# Patient Record
Sex: Male | Born: 1960 | Race: White | Hispanic: No | Marital: Married | Smoking: Current every day smoker
Health system: Southern US, Community
[De-identification: ages and names within clinical notes are randomized; demographics above are authoritative.]

## PROBLEM LIST (undated history)

## (undated) DIAGNOSIS — F17200 Nicotine dependence, unspecified, uncomplicated: Secondary | ICD-10-CM

## (undated) DIAGNOSIS — J302 Other seasonal allergic rhinitis: Secondary | ICD-10-CM

## (undated) DIAGNOSIS — R51 Headache: Secondary | ICD-10-CM

## (undated) DIAGNOSIS — N529 Male erectile dysfunction, unspecified: Secondary | ICD-10-CM

## (undated) HISTORY — DX: Nicotine dependence, unspecified, uncomplicated: F17.200

## (undated) HISTORY — DX: Headache: R51

## (undated) HISTORY — DX: Male erectile dysfunction, unspecified: N52.9

## (undated) HISTORY — DX: Other seasonal allergic rhinitis: J30.2

---
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
## 1999-02-15 ENCOUNTER — Emergency Department (HOSPITAL_COMMUNITY): Admission: EM | Admit: 1999-02-15 | Discharge: 1999-02-15 | Payer: Self-pay | Admitting: Emergency Medicine

## 1999-02-24 ENCOUNTER — Emergency Department (HOSPITAL_COMMUNITY): Admission: EM | Admit: 1999-02-24 | Discharge: 1999-02-24 | Payer: Self-pay | Admitting: Emergency Medicine

## 1999-07-04 ENCOUNTER — Encounter: Admission: RE | Admit: 1999-07-04 | Discharge: 1999-07-04 | Payer: Self-pay | Admitting: *Deleted

## 1999-07-04 ENCOUNTER — Encounter: Payer: Self-pay | Admitting: *Deleted

## 2010-10-06 ENCOUNTER — Encounter: Payer: Self-pay | Admitting: Family Medicine

## 2010-10-06 ENCOUNTER — Ambulatory Visit: Payer: Self-pay | Admitting: Family Medicine

## 2010-10-06 ENCOUNTER — Ambulatory Visit (INDEPENDENT_AMBULATORY_CARE_PROVIDER_SITE_OTHER): Payer: 59 | Admitting: Family Medicine

## 2010-10-06 ENCOUNTER — Ambulatory Visit
Admission: RE | Admit: 2010-10-06 | Discharge: 2010-10-06 | Disposition: A | Discharge status: Home / Self Care | Payer: 59 | Source: Ambulatory Visit | Attending: Family Medicine | Admitting: Family Medicine

## 2010-10-06 DIAGNOSIS — R1013 Epigastric pain: Secondary | ICD-10-CM

## 2010-10-06 DIAGNOSIS — N529 Male erectile dysfunction, unspecified: Secondary | ICD-10-CM | POA: Insufficient documentation

## 2010-10-06 DIAGNOSIS — Z23 Encounter for immunization: Secondary | ICD-10-CM

## 2010-10-06 DIAGNOSIS — J309 Allergic rhinitis, unspecified: Secondary | ICD-10-CM

## 2010-10-06 DIAGNOSIS — J302 Other seasonal allergic rhinitis: Secondary | ICD-10-CM | POA: Insufficient documentation

## 2010-10-06 LAB — CBC WITH DIFFERENTIAL/PLATELET
Basophils Absolute: 0 10*3/uL (ref 0.0–0.1)
Basophils Relative: 0.2 % (ref 0.0–3.0)
Eosinophils Absolute: 0.1 10*3/uL (ref 0.0–0.7)
Eosinophils Relative: 1 % (ref 0.0–5.0)
HCT: 45.3 % (ref 39.0–52.0)
Hemoglobin: 15.8 g/dL (ref 13.0–17.0)
Lymphocytes Relative: 15.7 % (ref 12.0–46.0)
Lymphs Abs: 1.3 10*3/uL (ref 0.7–4.0)
MCHC: 34.9 g/dL (ref 30.0–36.0)
MCV: 101.5 fl — ABNORMAL HIGH (ref 78.0–100.0)
Monocytes Absolute: 0.5 10*3/uL (ref 0.1–1.0)
Monocytes Relative: 5.7 % (ref 3.0–12.0)
Neutro Abs: 6.2 10*3/uL (ref 1.4–7.7)
Neutrophils Relative %: 77.4 % — ABNORMAL HIGH (ref 43.0–77.0)
Platelets: 186 10*3/uL (ref 150.0–400.0)
RBC: 4.47 Mil/uL (ref 4.22–5.81)
RDW: 12.5 % (ref 11.5–14.6)
WBC: 8 10*3/uL (ref 4.5–10.5)

## 2010-10-06 LAB — COMPREHENSIVE METABOLIC PANEL
ALT: 17 U/L (ref 0–53)
AST: 16 U/L (ref 0–37)
Albumin: 4.2 g/dL (ref 3.5–5.2)
Alkaline Phosphatase: 58 U/L (ref 39–117)
BUN: 15 mg/dL (ref 6–23)
CO2: 29 mEq/L (ref 19–32)
Calcium: 9.1 mg/dL (ref 8.4–10.5)
Chloride: 104 mEq/L (ref 96–112)
Creatinine, Ser: 0.9 mg/dL (ref 0.4–1.5)
GFR: 96.18 mL/min (ref >60.00)
Glucose, Bld: 89 mg/dL (ref 70–99)
Potassium: 4.3 mEq/L (ref 3.5–5.1)
Sodium: 139 mEq/L (ref 135–145)
Total Bilirubin: 0.9 mg/dL (ref 0.3–1.2)
Total Protein: 6.5 g/dL (ref 6.0–8.3)

## 2010-10-06 LAB — LIPASE: Lipase: 24 U/L (ref 11.0–59.0)

## 2010-10-06 MED ORDER — SILDENAFIL CITRATE 50 MG PO TABS: 50.0000 mg | ORAL_TABLET | Freq: Every day | ORAL | Status: DC | PRN

## 2010-10-06 NOTE — Progress Notes (Signed)
Subjective:    Patient ID: Gregory Johnson, male    DOB: Aug 15, 1960, 50 y.o.   MRN: [id number]  HPI CC: new patient, establish.  First time here, used to see Dr. Margrett Rud in [place], hasn't seen in years.  Firefighter.  Allergic rhinitis - takes zyrtec as needed, does well with this.  abd pain - Sometimes when eats greasy food, starts getting pain in middle of abdomen.  Sometimes bad pain.  Pain doesn't travel anywhere.  Sharp pain.  No burning or reflux sxs, not colicky or cramping.  Sudden onset, stops suddenly.  Lasts 10-30 min.  Used to chew tobacco years ago, not anymore.  Denies nausea/vomiting, diarrhea, constipation, blood in stool, fevers/chills.  Improved with weight loss (about 25 lbs in last several months, increased activity).  Last episode was 2-3 wks ago.  No early satiety.  ED - for last 6 wks.  Trouble getting erection and maintaining.  No problems with ejaculation.  Not getting erections when awakens in am.  Smoking - rare, when drinks beer.  Preventative: Blood work done 05/2010, CPE.  Cholesterol great.  Trig slightly high.  States sugar good and overall blood work was normal. Last tetanus - 7-8 years ago.  Exposed to children at work, will give Tdap.  Medications and allergies reviewed and updated in chart. There is no problem list on file for this patient.  Past Medical History  Diagnosis Date  . Seasonal allergies   . ED (erectile dysfunction)    Past Surgical History  Procedure Date  . Appendectomy 1978  . Hernia repair 1979    left side   History  Substance Use Topics  . Smoking status: Current Some Day Smoker    Types: Cigarettes  . Smokeless tobacco: Never Used  . Alcohol Use: Yes     Occasionally - 6 pack q2 wks   Family History  Problem Relation Age of Onset  . Diabetes Mother   . Hyperlipidemia Mother   . Dementia Father   . Cancer Maternal Grandmother     breast cancer  . Cancer Paternal Grandmother     bone cancer  . Coronary  artery disease Neg Hx   . Stroke Neg Hx    No Known Allergies No current outpatient prescriptions on file prior to visit.    Review of Systems  Constitutional: Negative for fever, chills, activity change, appetite change, fatigue and unexpected weight change.  HENT: Negative for hearing loss and neck pain.   Eyes: Negative for visual disturbance.  Respiratory: Negative for cough, chest tightness, shortness of breath and wheezing.   Cardiovascular: Negative for chest pain, palpitations and leg swelling.  Gastrointestinal: Positive for abdominal pain. Negative for nausea, vomiting, diarrhea, constipation, blood in stool and abdominal distention.  Genitourinary: Negative for hematuria and difficulty urinating.  Musculoskeletal: Negative for myalgias and arthralgias.  Skin: Negative for rash.  Neurological: Negative for dizziness, seizures, syncope and headaches.  Hematological: Does not bruise/bleed easily.  Psychiatric/Behavioral: Negative for dysphoric mood. The patient is not nervous/anxious.        Objective:   Physical Exam  Nursing note and vitals reviewed. Constitutional: He is oriented to person, place, and time. He appears well-developed and well-nourished. No distress.  HENT:  Head: Normocephalic and atraumatic.  Right Ear: External ear normal.  Left Ear: External ear normal.  Nose: Nose normal.  Mouth/Throat: Oropharynx is clear and moist.  Eyes: Conjunctivae and EOM are normal. Pupils are equal, round, and reactive to light.  Neck:  Normal range of motion. Neck supple. No thyromegaly present.  Cardiovascular: Normal rate, regular rhythm, normal heart sounds and intact distal pulses.   No murmur heard. Pulses:      Radial pulses are 2+ on the right side, and 2+ on the left side.  Pulmonary/Chest: Effort normal and breath sounds normal. No respiratory distress. He has no wheezes. He has no rales.  Abdominal: Soft. Bowel sounds are normal. He exhibits no distension. There  is hepatomegaly. There is no splenomegaly. There is no tenderness. There is no rebound and no guarding. Hernia confirmed negative in the right inguinal area and confirmed negative in the left inguinal area.       Some HM to 2 finger widths below R costal margin, nontender.  Genitourinary: Testes normal and penis normal. Right testis shows no mass. Left testis shows no mass. Circumcised. No penile tenderness.  Musculoskeletal: Normal range of motion.  Lymphadenopathy:    He has no cervical adenopathy.       Right: No inguinal adenopathy present.       Left: No inguinal adenopathy present.  Neurological: He is alert and oriented to person, place, and time.       CN grossly intact, station and gait intact  Skin: Skin is warm and dry. No rash noted.  Psychiatric: He has a normal mood and affect. His behavior is normal. Judgment and thought content normal.       Assessment & Plan:

## 2010-10-06 NOTE — Assessment & Plan Note (Signed)
Story possible GERD vs gallbladder etiology. ? HSM on exam, check LFTs and abd [place]. Consider PPI. no red flags.

## 2010-10-06 NOTE — Assessment & Plan Note (Signed)
Well controlled on zyrtec prn.

## 2010-10-06 NOTE — Patient Instructions (Signed)
Trial of viagra Tdap today. Drop off recent blood work. Liver check today, we will set you up with abdominal ultrasound. Return in 1 year or as needed for follow up, sooner if abdominal pain not improving. Call us with questions.  Good to meet you today.

## 2010-10-06 NOTE — Assessment & Plan Note (Signed)
Asked patient to drop off recent blood work, will review. Trial of viagra.  Discussed psychological etiology, may just need for confidence. If not improved, discussed may refer to urology (as endorses decreased nocturnal erections).  No fatigue, no mood issues, however consider testing testosterone.  Ensure has had TSH.

## 2010-10-07 LAB — B12 AND FOLATE PANEL
Folate: 7.5 ng/mL (ref >5.9)
Vitamin B-12: 231 pg/mL (ref 211–911)

## 2010-10-07 NOTE — Progress Notes (Signed)
Addended by: Josph Macho A on: 10/07/2010 10:29 AM   Modules accepted: Orders

## 2010-11-22 ENCOUNTER — Other Ambulatory Visit: Payer: Self-pay | Admitting: Family Medicine

## 2010-11-22 MED ORDER — CYANOCOBALAMIN 1000 MCG PO TABS: 1000.0000 ug | ORAL_TABLET | Freq: Every day | ORAL | Status: AC

## 2010-11-22 NOTE — Telephone Encounter (Signed)
Okay to refill? 

## 2010-11-22 NOTE — Telephone Encounter (Signed)
Ok to refill, but please check with patient if he ever dropped off previous blood work (cholesterol levels and thyroid?) Also added recommended B12 to list of meds.

## 2010-11-23 NOTE — Telephone Encounter (Signed)
Message left for patient to return my call.  

## 2010-11-25 NOTE — Telephone Encounter (Signed)
Spoke with patient. He said he forgot to drop of lab results, but he was writing himself a note to drop them off when he finds them. Notified him of refills and to start OTC B-12.

## 2011-05-25 ENCOUNTER — Other Ambulatory Visit: Payer: Self-pay | Admitting: Family Medicine

## 2011-05-25 NOTE — Telephone Encounter (Signed)
OK to refill

## 2011-09-30 ENCOUNTER — Other Ambulatory Visit: Payer: Self-pay | Admitting: Family Medicine

## 2011-10-16 HISTORY — PX: EPIDURAL BLOOD PATCH: SHX1517

## 2011-10-21 ENCOUNTER — Emergency Department (HOSPITAL_COMMUNITY)
Admission: EM | Admit: 2011-10-21 | Discharge: 2011-10-21 | Disposition: A | Discharge status: Home / Self Care | Payer: 59 | Source: Home / Self Care | Attending: Emergency Medicine | Admitting: Emergency Medicine

## 2011-10-21 ENCOUNTER — Encounter (HOSPITAL_COMMUNITY): Payer: Self-pay | Admitting: *Deleted

## 2011-10-21 DIAGNOSIS — E860 Dehydration: Secondary | ICD-10-CM

## 2011-10-21 DIAGNOSIS — H81399 Other peripheral vertigo, unspecified ear: Secondary | ICD-10-CM

## 2011-10-21 DIAGNOSIS — H9319 Tinnitus, unspecified ear: Secondary | ICD-10-CM

## 2011-10-21 LAB — POCT I-STAT, CHEM 8
BUN: 8 mg/dL (ref 6–23)
Calcium, Ion: 1.23 mmol/L (ref 1.12–1.23)
Creatinine, Ser: 0.8 mg/dL (ref 0.50–1.35)
Glucose, Bld: 118 mg/dL — ABNORMAL HIGH (ref 70–99)
Sodium: 142 mEq/L (ref 135–145)
TCO2: 22 mmol/L (ref 0–100)

## 2011-10-21 MED ORDER — MECLIZINE HCL 12.5 MG PO TABS: 12.5000 mg | ORAL_TABLET | Freq: Three times a day (TID) | ORAL | Status: AC | PRN

## 2011-10-21 NOTE — ED Notes (Addendum)
Reports waking up with posterior HA this AM.  States normally if he has a HA, is able to take Advil with complete relief.  Has taken "6 Advil" today without any relief.  C/O decreased hearing in bilat ears ("stopped up") throughout the day, admits he has some slight tinnitus.  Has had "very little" sinus drainage.  Denies weakness, parasthesias.  C/O some slight nausea "if I'm up", which improves if he lays down.

## 2011-10-21 NOTE — ED Provider Notes (Signed)
History     CSN: [id number]  Arrival date & time 10/21/11  1718   First MD Initiated Contact with Patient 10/21/11 1724      Chief Complaint  Patient presents with  . Headache  . Hearing Problem    (Consider location/radiation/quality/duration/timing/severity/associated sxs/prior treatment) HPI Comments: Patient describes that this morning he woke up with a headache The back of his head. He took 2 tablets of Motrin and went to work. Headache seemed to be about the same but has noticed somewhat since yesterday worsening today at both of these ears feel stopped up and describes that when he moves around changes position feels somewhat dizzy and have also perceive a ringing sound from both ears. On further questioning patient admits of having a almost constant sinus congestion and postnasal drainage. Patient denies symptoms such as numbness, tingling, weakness of any upper or lower extremity or facial changes or speech problems.  As he is sitting he feels fine but if he moves slightly from one side to the next or moves abruptly feels somewhat a bit dizzy. Patient denies any fevers, constitutional symptoms such as fatigue, unintentional weight loss, arthralgias or myalgias, and no visual changes.  Patient is a 51 y.o. male presenting with headaches. The history is provided by the patient.  Headache The primary symptoms include headaches and dizziness. Primary symptoms do not include syncope, loss of consciousness, altered mental status, seizures, visual change, paresthesias, focal weakness, loss of sensation, memory loss, fever or vomiting. The symptoms began 6 to 12 hours ago. The symptoms are unchanged. The neurological symptoms are focal.  The headache is not associated with aura, photophobia, eye pain, visual change, neck stiffness, paresthesias or weakness.  Dizziness also occurs with tinnitus. Dizziness does not occur with vomiting, weakness or diaphoresis.   Additional symptoms include  tinnitus and vertigo. Additional symptoms do not include neck stiffness, weakness, lower back pain, leg pain, photophobia, aura, nystagmus, taste disturbance or hyperacusis.    Past Medical History  Diagnosis Date  . Seasonal allergies   . ED (erectile dysfunction)     Past Surgical History  Procedure Date  . Appendectomy 1978  . Hernia repair 1979    left side    Family History  Problem Relation Age of Onset  . Diabetes Mother   . Hyperlipidemia Mother   . Dementia Father   . Cancer Maternal Grandmother     breast cancer  . Cancer Paternal Grandmother     bone cancer  . Coronary artery disease Neg Hx   . Stroke Neg Hx     History  Substance Use Topics  . Smoking status: Current Some Day Smoker    Types: Cigarettes  . Smokeless tobacco: Never Used  . Alcohol Use: Yes     Occasionally      Review of Systems  Constitutional: Positive for activity change. Negative for fever, chills, diaphoresis and fatigue.  HENT: Positive for ear pain, congestion, postnasal drip, sinus pressure and tinnitus. Negative for sore throat, facial swelling, sneezing, mouth sores, trouble swallowing, neck pain, neck stiffness and dental problem.   Eyes: Negative for photophobia and pain.  Cardiovascular: Negative for syncope.  Gastrointestinal: Negative for vomiting.  Skin: Negative for rash.  Neurological: Positive for dizziness, vertigo and headaches. Negative for tremors, focal weakness, seizures, loss of consciousness, syncope, speech difficulty, weakness, light-headedness, numbness and paresthesias.  Psychiatric/Behavioral: Negative for memory loss, behavioral problems, confusion, decreased concentration, agitation and altered mental status.    Allergies  Review of  patient's allergies indicates no known allergies.  Home Medications   Current Outpatient Rx  Name Route Sig Dispense Refill  . CETIRIZINE HCL 10 MG PO TABS Oral Take 10 mg by mouth daily as needed.      .  CYANOCOBALAMIN 1000 MCG PO TABS Oral Take 1 tablet (1,000 mcg total) by mouth daily. 100 tablet 2  . VIAGRA 50 MG PO TABS  TAKE 1 TABLET (50 MG TOTAL) BY MOUTH DAILY AS NEEDED FOR ERECTILE DYSFUNCTION. 10 tablet 1  . MECLIZINE HCL 12.5 MG PO TABS Oral Take 1 tablet (12.5 mg total) by mouth 3 (three) times daily as needed. 15 tablet 0    BP 116/74  Pulse 62  Temp 98 F (36.7 C) (Oral)  Resp 16  SpO2 97%  Physical Exam  Nursing note and vitals reviewed. Constitutional: He is oriented to person, place, and time. He appears well-developed and well-nourished. No distress.  HENT:  Head: Normocephalic.  Right Ear: Tympanic membrane normal.  Left Ear: Tympanic membrane normal.  Mouth/Throat: Uvula is midline. Mucous membranes are not pale, dry and not cyanotic. No oropharyngeal exudate, posterior oropharyngeal edema, posterior oropharyngeal erythema or tonsillar abscesses.  Eyes: Conjunctivae and EOM are normal. Pupils are equal, round, and reactive to light.  Cardiovascular: Normal rate.  Exam reveals no gallop and no friction rub.   No murmur heard. Pulmonary/Chest: Effort normal.  Neurological: He is alert and oriented to person, place, and time. He has normal strength. He is not disoriented. He displays no tremor. No cranial nerve deficit or sensory deficit. He exhibits normal muscle tone. He displays a negative Romberg sign. Coordination abnormal. Gait normal. GCS eye subscore is 4. GCS verbal subscore is 5. GCS motor subscore is 6.    ED Course  Procedures (including critical care time)  Labs Reviewed  POCT I-STAT, CHEM 8 - Abnormal; Notable for the following:    Glucose, Bld 118 (*)     All other components within normal limits   No results found.   1. Peripheral vertigo   2. Tinnitus   3. Dehydration       MDM  Nonspecific headache associated with bilateral otalgia mild positional vertigo and tinnitus of both ears. Symptoms for less than 24 hours, patient neurological  exam was intact including ambulation test. Denies any paresthesias, or muscular weakness. Patient clinically with dry oral) left suggesting mild dehydration. Year and neurological exam were unremarkable. Have encouraged patient to pursue a empirical treatment with meclizine for the next 48-72 hours and to rehydrate himself aggressively in the next 12 hours. Patient was instructed about symptoms that should warrant immediate emergent attention such as worsening headache, as well as other neurological symptoms such as visual changes, ambulation changes numbness, or muscular weakness. Patient agree with treatment plan and followup care as necessary. Also encouraged patient to followup with his primary care Dr. if symptoms were to persist beyond a week to further elucidate this vertigo syndrome if no improvement he understands and agrees with followup care      Jimmie Molly, MD 10/21/11 1836

## 2011-10-24 ENCOUNTER — Ambulatory Visit (INDEPENDENT_AMBULATORY_CARE_PROVIDER_SITE_OTHER): Payer: 59 | Admitting: Family Medicine

## 2011-10-24 ENCOUNTER — Encounter: Payer: Self-pay | Admitting: Family Medicine

## 2011-10-24 ENCOUNTER — Telehealth: Payer: Self-pay

## 2011-10-24 ENCOUNTER — Ambulatory Visit
Admission: RE | Admit: 2011-10-24 | Discharge: 2011-10-24 | Disposition: A | Discharge status: Home / Self Care | Payer: 59 | Source: Ambulatory Visit | Attending: Family Medicine | Admitting: Family Medicine

## 2011-10-24 VITALS — BP 122/76 | HR 56 | Temp 98.0°F

## 2011-10-24 DIAGNOSIS — R51 Headache with orthostatic component, not elsewhere classified: Secondary | ICD-10-CM

## 2011-10-24 DIAGNOSIS — R519 Headache, unspecified: Secondary | ICD-10-CM | POA: Insufficient documentation

## 2011-10-24 HISTORY — DX: Headache with orthostatic component, not elsewhere classified: R51.0

## 2011-10-24 MED ORDER — TRAMADOL HCL 50 MG PO TABS: 50.0000 mg | ORAL_TABLET | Freq: Two times a day (BID) | ORAL | Status: AC | PRN

## 2011-10-24 MED ORDER — GADOBENATE DIMEGLUMINE 529 MG/ML IV SOLN
17.0000 mL | Freq: Once | INTRAVENOUS | Status: AC | PRN
  Administered 2011-10-24: 17 mL via INTRAVENOUS

## 2011-10-24 MED ORDER — DOXYCYCLINE MONOHYDRATE 100 MG PO CAPS: 100.0000 mg | ORAL_CAPSULE | Freq: Two times a day (BID) | ORAL | Status: AC

## 2011-10-24 NOTE — Assessment & Plan Note (Addendum)
Given recent tick bite, will treat empirically with 10 d course of doxycycline. 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 sxs going on 3 days, not improving. rec increase caffeine intake, increase fluid intake as well Check brain MRI with contrast to dx this as well as r/o other causes such as stroke, subdural hematoma. Consider blood patch, referral to neurology.

## 2011-10-24 NOTE — Addendum Note (Signed)
Addended by: Eustaquio Boyden on: 10/24/2011 10:34 AM   Modules accepted: Orders

## 2011-10-24 NOTE — Progress Notes (Signed)
Subjective:    Patient ID: Gregory Johnson, male    DOB: 10-06-1960, 51 y.o.   MRN: [id number]  HPI CC: f/u [facility]  sxs started 4d ago.  Started with headache, took advil and then tylenol which didn't help.  Whey laying down felt better.  Whenever sitting or standing up, pain would return.  Decreased appetite, not eating or drinking.  Some occasional nausea 2/2 pain.  Initially did have some dizziness described as pressure in head, that has improved.  Laying flat completely resolves sxs.  Headache described as pressure and throbbing that starts at base of neck, then travels bilaterally to temples.  At worst - 7-8/10.  Currently 4/10.  When laying down 1/10.  Some photophobia, no phonophobia.  Seen at [facility], records reviewed.  Prescribed meclizine which hasn't helped at all.    No fevers/chills, head congestion, ST, coughing, ear pain.  No abd pain, chest pain, vomiting.  No current dizziness.  No h/o migraines. No recent steroid shots into back.  Has pulled a few ticks off this summer. No rashes, joint pains.  Smoker - about 1/2 ppd, hasn't smoked in 3 days.  No family history of strokes. States lipids checked at work normal.  Medications and allergies reviewed and updated in chart.  Past histories reviewed and updated if relevant as below. Patient Active Problem List  Diagnosis  . ED (erectile dysfunction)  . Seasonal allergies  . Epigastric abdominal pain   Past Medical History  Diagnosis Date  . Seasonal allergies   . ED (erectile dysfunction)    Past Surgical History  Procedure Date  . Appendectomy 1978  . Hernia repair 1979    left side   History  Substance Use Topics  . Smoking status: Current Some Day Smoker    Types: Cigarettes  . Smokeless tobacco: Never Used  . Alcohol Use: Yes     Occasionally   Family History  Problem Relation Age of Onset  . Diabetes Mother   . Hyperlipidemia Mother   . Dementia Father   . Cancer Maternal Grandmother     breast  cancer  . Cancer Paternal Grandmother     bone cancer  . Coronary artery disease Neg Hx   . Stroke Neg Hx    No Known Allergies Current Outpatient Prescriptions on File Prior to Visit  Medication Sig Dispense Refill  . cetirizine (ZYRTEC) 10 MG tablet Take 10 mg by mouth daily as needed.        . cyanocobalamin ([facility] VITAMIN B12) 1000 MCG tablet Take 1 tablet (1,000 mcg total) by mouth daily.  100 tablet  2  . meclizine (ANTIVERT) 12.5 MG tablet Take 1 tablet (12.5 mg total) by mouth 3 (three) times daily as needed.  15 tablet  0  . VIAGRA 50 MG tablet TAKE 1 TABLET (50 MG TOTAL) BY MOUTH DAILY AS NEEDED FOR ERECTILE DYSFUNCTION.  10 tablet  1     Review of Systems Per HPI    Objective:   Physical Exam  Vitals reviewed. Constitutional: He appears well-developed and well-nourished. No distress.  HENT:  Head: Normocephalic and atraumatic.  Right Ear: Hearing, tympanic membrane, external ear and ear canal normal.  Left Ear: Hearing, tympanic membrane, external ear and ear canal normal.  Nose: Right sinus exhibits no maxillary sinus tenderness and no frontal sinus tenderness. Left sinus exhibits no maxillary sinus tenderness and no frontal sinus tenderness.  Mouth/Throat: Uvula is midline, oropharynx is clear and moist and mucous membranes are  normal. No oropharyngeal exudate, posterior oropharyngeal edema, posterior oropharyngeal erythema or tonsillar abscesses.  Eyes: Conjunctivae and EOM are normal. Pupils are equal, round, and reactive to light. No scleral icterus. Right eye exhibits normal extraocular motion and no nystagmus. Left eye exhibits normal extraocular motion and no nystagmus.       Fundoscopic exam with intact optic nerves  Neck: Normal range of motion. Neck supple. Carotid bruit is not present.  Cardiovascular: Normal rate, regular rhythm, normal heart sounds and intact distal pulses.   No murmur heard. Pulmonary/Chest: Effort normal and breath sounds normal. No  respiratory distress. He has no wheezes. He has no rales.  Neurological: He has normal strength. No cranial nerve deficit or sensory deficit. He displays a negative Romberg sign. Coordination normal.       CN2-12 intact. Normal FTN, HTS. Neg pronator drift.  Skin: Skin is warm and dry. No rash noted.       Assessment & Plan:

## 2011-10-24 NOTE — Patient Instructions (Signed)
I think you have low CSF pressure headache - I want to obtain MRI to eval for this and rule out other cause such as subdural hematoma.   I want you to decrease smoking and increase amount of caffeine you drink for next several days - as this can improve pain. Depending on MRI results, may send you to neurologist. If worsening, please seek urgent care again.

## 2011-10-24 NOTE — Telephone Encounter (Signed)
Spoke with Gregory Johnson. Will refer to neurology.  Consider blood patch. Will try to expedite neuro referral.

## 2011-10-24 NOTE — Telephone Encounter (Signed)
When get MR brain results please call Delorese Kenna [phone].

## 2011-10-25 ENCOUNTER — Ambulatory Visit
Admission: RE | Admit: 2011-10-25 | Discharge: 2011-10-25 | Disposition: A | Discharge status: Home / Self Care | Payer: 59 | Source: Ambulatory Visit | Attending: Neurology | Admitting: Neurology

## 2011-10-25 ENCOUNTER — Other Ambulatory Visit: Payer: Self-pay | Admitting: Neurology

## 2011-10-25 DIAGNOSIS — R51 Headache: Secondary | ICD-10-CM

## 2011-10-25 MED ORDER — IOHEXOL 180 MG/ML  SOLN
15.0000 mL | Freq: Once | INTRAMUSCULAR | Status: AC | PRN
  Administered 2011-10-25: 15 mL via INTRATHECAL

## 2011-10-25 NOTE — Progress Notes (Addendum)
20cc blood drawn for procedure from left [facility] space without difficulty; site unremarkable.  Discharge instructions and procedure explained to patient and his wife, who is at bedside.  Larina Earthly, RN  1300 pt returned after blood patch and will be watched for 30 minutes post procedure.   wife at bedside.

## 2011-10-31 ENCOUNTER — Telehealth: Payer: Self-pay | Admitting: Family Medicine

## 2011-10-31 NOTE — Telephone Encounter (Signed)
Spoke with patient's wife. She did say that she finally talked with someone at neuro office who was calling the dr, so now she is just waiting to hear back again. I advised to give them a little more time because they may be trying to get MRI ordered and approved. She did advise no fevers. Neck pressure is positional and he is staying well hydrated. She will call us back if he worsens or if anything changes.

## 2011-10-31 NOTE — Telephone Encounter (Signed)
I'm glad headache is better.  Is neck pressure also positional? I would give neuro more time - if has not heard from them by end of week to call me, update me sooner if other sxs. Has he been staying well hydrated?  plz ensure no fevers.

## 2011-10-31 NOTE — Telephone Encounter (Signed)
Pt had a blood patch to patch spinal fluid. It took pressure off his head but he still has pressure on his neck. The Neuro Doc said they wanted to do a MRI of his back if the pain did not go away. Pt's wife has called 3 times and they haven't gotten back to them yet. She was wondering what they should do or if you need to evaluate him ??

## 2011-11-02 ENCOUNTER — Other Ambulatory Visit (HOSPITAL_COMMUNITY): Payer: Self-pay | Admitting: Neurology

## 2011-11-02 ENCOUNTER — Other Ambulatory Visit: Payer: Self-pay | Admitting: Neurology

## 2011-11-02 DIAGNOSIS — R51 Headache: Secondary | ICD-10-CM

## 2011-11-03 ENCOUNTER — Other Ambulatory Visit: Payer: Self-pay | Admitting: Neurology

## 2011-11-03 ENCOUNTER — Ambulatory Visit
Admission: RE | Admit: 2011-11-03 | Discharge: 2011-11-03 | Disposition: A | Discharge status: Home / Self Care | Payer: 59 | Source: Ambulatory Visit | Attending: Neurology | Admitting: Neurology

## 2011-11-03 VITALS — BP 116/70 | HR 62

## 2011-11-03 LAB — GRAM STAIN: Gram Stain: NONE SEEN

## 2011-11-03 LAB — CSF CELL COUNT WITH DIFFERENTIAL
Tube #: 4
WBC, CSF: 10 cu mm — ABNORMAL HIGH (ref 0–5)

## 2011-11-03 LAB — GLUCOSE, CSF: Glucose, CSF: 89 mg/dL — ABNORMAL HIGH (ref 43–76)

## 2011-11-03 LAB — PROTEIN, CSF: Total Protein, CSF: 686 mg/dL — ABNORMAL HIGH (ref 15–45)

## 2011-11-03 MED ORDER — IOHEXOL 300 MG/ML  SOLN
10.0000 mL | Freq: Once | INTRAMUSCULAR | Status: AC | PRN
  Administered 2011-11-03: 10 mL via INTRATHECAL

## 2011-11-03 MED ORDER — DIAZEPAM 5 MG PO TABS
10.0000 mg | ORAL_TABLET | Freq: Once | ORAL | Status: AC
  Administered 2011-11-03: 10 mg via ORAL

## 2011-11-03 NOTE — Progress Notes (Signed)
Dr. Alfredo Batty in to speak with patient about findings after procedure.  jkl

## 2011-11-03 NOTE — Progress Notes (Signed)
Explained to pt he is to have a lumbar puncture as well as a myelogram and that the discharge instructions are the same as when he had his blood patch last week.  Pt states his headache is better post blood patch.

## 2011-11-03 NOTE — Progress Notes (Addendum)
Dr. Alfredo Batty in to speak to pt and his wife re: lumbar puncture and myelogram. Got a mixed injection and the procedure will be repeated next Friday. Dr. Alfredo Batty spoke at length explained all that was done and why it needed to be repeated. [email or web address] rn

## 2011-11-03 NOTE — Progress Notes (Signed)
Patient states he has been off Tramadol for the past two days.  Larina Earthly, RN

## 2011-11-03 NOTE — Progress Notes (Signed)
Patient's wife at bedside.  Dr. Alfredo Batty to discuss findings from procedure with her.  Patient resting quietly without complaint.  jkl

## 2011-11-06 LAB — HERPES SIMPLEX VIRUS(HSV) DNA BY PCR
HSV 1 DNA: NOT DETECTED
HSV 2 DNA: NOT DETECTED

## 2011-11-07 ENCOUNTER — Other Ambulatory Visit: Payer: Self-pay | Admitting: Neurology

## 2011-11-10 ENCOUNTER — Other Ambulatory Visit (HOSPITAL_COMMUNITY): Payer: Self-pay | Admitting: Neurology

## 2011-11-10 ENCOUNTER — Ambulatory Visit
Admission: RE | Admit: 2011-11-10 | Discharge: 2011-11-10 | Disposition: A | Discharge status: Home / Self Care | Payer: 59 | Source: Ambulatory Visit | Attending: Neurology | Admitting: Neurology

## 2011-11-10 ENCOUNTER — Other Ambulatory Visit (HOSPITAL_COMMUNITY): Payer: Self-pay | Admitting: Radiology

## 2011-11-10 VITALS — BP 109/70 | HR 63

## 2011-11-10 DIAGNOSIS — R51 Headache: Secondary | ICD-10-CM

## 2011-11-10 MED ORDER — IOHEXOL 300 MG/ML  SOLN
10.0000 mL | Freq: Once | INTRAMUSCULAR | Status: AC | PRN
  Administered 2011-11-10: 10 mL via INTRATHECAL

## 2011-11-10 MED ORDER — DIAZEPAM 5 MG PO TABS
10.0000 mg | ORAL_TABLET | Freq: Once | ORAL | Status: AC
  Administered 2011-11-10: 10 mg via ORAL

## 2011-11-10 NOTE — Progress Notes (Signed)
Pt states he has been off tramadol for the past 2 days. 

## 2011-11-13 ENCOUNTER — Other Ambulatory Visit (HOSPITAL_COMMUNITY): Payer: Self-pay | Admitting: Neurology

## 2011-11-13 ENCOUNTER — Ambulatory Visit (HOSPITAL_COMMUNITY)
Admission: RE | Admit: 2011-11-13 | Discharge: 2011-11-13 | Disposition: A | Discharge status: Home / Self Care | Payer: 59 | Source: Ambulatory Visit | Attending: Neurology | Admitting: Neurology

## 2011-11-13 ENCOUNTER — Inpatient Hospital Stay (HOSPITAL_COMMUNITY): Admission: RE | Admit: 2011-11-13 | Payer: 59 | Source: Ambulatory Visit

## 2011-11-13 DIAGNOSIS — R51 Headache: Secondary | ICD-10-CM

## 2011-11-13 DIAGNOSIS — R839 Unspecified abnormal finding in cerebrospinal fluid: Secondary | ICD-10-CM | POA: Insufficient documentation

## 2011-11-13 MED ORDER — IOHEXOL 180 MG/ML  SOLN
20.0000 mL | Freq: Once | INTRAMUSCULAR | Status: AC | PRN
  Administered 2011-11-13: 3 mL via EPIDURAL

## 2011-11-19 ENCOUNTER — Encounter: Payer: Self-pay | Admitting: Family Medicine

## 2012-03-02 ENCOUNTER — Other Ambulatory Visit: Payer: Self-pay | Admitting: Family Medicine

## 2012-04-03 ENCOUNTER — Other Ambulatory Visit: Payer: Self-pay | Admitting: *Deleted

## 2012-04-03 ENCOUNTER — Other Ambulatory Visit: Payer: Self-pay | Admitting: Internal Medicine

## 2012-04-03 ENCOUNTER — Other Ambulatory Visit (INDEPENDENT_AMBULATORY_CARE_PROVIDER_SITE_OTHER): Payer: 59

## 2012-04-03 DIAGNOSIS — Z0000 Encounter for general adult medical examination without abnormal findings: Secondary | ICD-10-CM

## 2012-04-03 LAB — HIV ANTIBODY (ROUTINE TESTING W REFLEX): HIV: NONREACTIVE

## 2012-04-03 LAB — HEPATITIS A ANTIBODY, IGM: Hep A IgM: NEGATIVE

## 2012-04-03 LAB — HEPATITIS B SURFACE ANTIBODY,QUALITATIVE: Hep B S Ab: UNDETERMINED — AB

## 2012-04-03 LAB — RPR

## 2012-04-04 LAB — CLOSTRIDIUM DIFFICILE BY PCR: Toxigenic C. Difficile by PCR: NOT DETECTED

## 2012-04-07 LAB — STOOL CULTURE

## 2012-06-11 LAB — CBC
HGB: 14.9 g/dL
platelet count: 207

## 2012-06-11 LAB — LIPID PANEL
Cholesterol: 156 mg/dL (ref 0–200)
LDL (calc): 106

## 2012-06-11 LAB — COMPREHENSIVE METABOLIC PANEL
ALT: 14 U/L (ref 10–40)
ALT: 14 U/L (ref 10–40)
AST: 14 U/L
Alkaline Phosphatase: 46 U/L
Creat: 0.95
Creat: 0.95
Glucose: 82
Sodium: 142 mmol/L (ref 137–147)
Total Bilirubin: 0.6 mg/dL
Total Bilirubin: 0.6 mg/dL

## 2012-09-03 ENCOUNTER — Other Ambulatory Visit: Payer: Self-pay | Admitting: Family Medicine

## 2012-10-01 ENCOUNTER — Other Ambulatory Visit: Payer: Self-pay | Admitting: Family Medicine

## 2012-10-04 ENCOUNTER — Other Ambulatory Visit: Payer: Self-pay | Admitting: Family Medicine

## 2012-10-07 ENCOUNTER — Other Ambulatory Visit: Payer: Self-pay

## 2012-10-07 MED ORDER — SILDENAFIL CITRATE 50 MG PO TABS: ORAL_TABLET | ORAL | Status: DC

## 2012-10-07 NOTE — Telephone Encounter (Signed)
Pt left v/m has CPX scheduled 10/21/12; pt request refill Viagra to [facility] [address]. Pt request cb when refilled.

## 2012-10-07 NOTE — Telephone Encounter (Signed)
plz notify sent in. 

## 2012-10-08 ENCOUNTER — Encounter: Payer: Self-pay | Admitting: Family Medicine

## 2012-10-08 ENCOUNTER — Ambulatory Visit (INDEPENDENT_AMBULATORY_CARE_PROVIDER_SITE_OTHER): Payer: 59 | Admitting: Family Medicine

## 2012-10-08 VITALS — BP 126/82 | HR 80 | Temp 98.4°F | Ht 72.0 in | Wt 188.8 lb

## 2012-10-08 DIAGNOSIS — R972 Elevated prostate specific antigen [PSA]: Secondary | ICD-10-CM

## 2012-10-08 DIAGNOSIS — Z1211 Encounter for screening for malignant neoplasm of colon: Secondary | ICD-10-CM

## 2012-10-08 DIAGNOSIS — Z0000 Encounter for general adult medical examination without abnormal findings: Secondary | ICD-10-CM | POA: Insufficient documentation

## 2012-10-08 DIAGNOSIS — N529 Male erectile dysfunction, unspecified: Secondary | ICD-10-CM

## 2012-10-08 DIAGNOSIS — Z0001 Encounter for general adult medical examination with abnormal findings: Secondary | ICD-10-CM | POA: Insufficient documentation

## 2012-10-08 LAB — HEMOCCULT GUIAC POC 1CARD (OFFICE): Fecal Occult Blood, POC: NEGATIVE

## 2012-10-08 MED ORDER — SILDENAFIL CITRATE 50 MG PO TABS: ORAL_TABLET | ORAL | Status: DC

## 2012-10-08 NOTE — Progress Notes (Signed)
Subjective:    Patient ID: Gregory Johnson, male    DOB: 11-Dec-1960, 52 y.o.   MRN: [id number]  HPI CC: CPE today  No trouble recently.  orthostatic headaches resolved after blood patch (10/2011).  Brings blood work from city - 2013 PSA 0.7, then 2014 jumped up to 2.77.    Smoking - some days up to 1/2 ppd, with EtOH.  Preventative: No recent CPE Colon cancer screening - discussed, would like iFOB. Prostate screening - see above. Tdap - 2012  Caffeine: 2 cups coffee, 1 coke daily, lots of soda Lives with wife, 2 daughters who recently graduated college, 3 dogs Edu: some college Occupation: IT sales professional Activity: walks, treadmill, started going to gym Diet: good water, some fruits/vegetables   Medications and allergies reviewed and updated in chart.  Past histories reviewed and updated if relevant as below. Patient Active Problem List   Diagnosis Date Noted  . Orthostatic headache 10/24/2011  . Epigastric abdominal pain 10/06/2010  . ED (erectile dysfunction)   . Seasonal allergies    Past Medical History  Diagnosis Date  . Seasonal allergies   . ED (erectile dysfunction)   . Orthostatic headache 10/24/2011    found to have T3/4 prominent lateral osteophytes on CT myelo with epidural contrast at levels below this, resolved after fluoro guided blood patch (Dr. Milinda Pointer)   Past Surgical History  Procedure Laterality Date  . Appendectomy  1978  . Hernia repair  1979    left side  . Epidural blood patch  10/2011    spontaneous low CSF pressure HAs   History  Substance Use Topics  . Smoking status: Current Some Day Smoker    Types: Cigarettes  . Smokeless tobacco: Never Used  . Alcohol Use: Yes     Comment: Occasionally   Family History  Problem Relation Age of Onset  . Diabetes Mother   . Hyperlipidemia Mother   . Dementia Father 51  . Cancer Maternal Grandmother     breast cancer  . Cancer Paternal Grandmother     bone cancer  . Coronary artery disease Neg Hx    . Stroke Neg Hx    No Known Allergies Current Outpatient Prescriptions on File Prior to Visit  Medication Sig Dispense Refill  . sildenafil (VIAGRA) 50 MG tablet TAKE 1 TABLET (50 MG TOTAL) BY MOUTH DAILY AS NEEDED FOR ERECTILE DYSFUNCTION.  8 tablet  3  . cetirizine (ZYRTEC) 10 MG tablet Take 10 mg by mouth daily as needed.         No current facility-administered medications on file prior to visit.      Review of Systems  Constitutional: Negative for fever, chills, activity change, appetite change, fatigue and unexpected weight change.  HENT: Negative for hearing loss and neck pain.   Eyes: Negative for visual disturbance.  Respiratory: Negative for cough, chest tightness, shortness of breath and wheezing.   Cardiovascular: Negative for chest pain, palpitations and leg swelling.  Gastrointestinal: Negative for nausea, vomiting, abdominal pain, diarrhea, constipation, blood in stool and abdominal distention.  Genitourinary: Negative for hematuria and difficulty urinating.  Musculoskeletal: Negative for myalgias and arthralgias.  Skin: Negative for rash.  Neurological: Negative for dizziness, seizures, syncope and headaches.  Hematological: Negative for adenopathy. Does not bruise/bleed easily.  Psychiatric/Behavioral: Negative for dysphoric mood. The patient is not nervous/anxious.        Objective:   Physical Exam  Nursing note and vitals reviewed. Constitutional: He is oriented to person, place, and time.  He appears well-developed and well-nourished. No distress.  HENT:  Head: Normocephalic and atraumatic.  Right Ear: Hearing, tympanic membrane, external ear and ear canal normal.  Left Ear: Hearing, tympanic membrane, external ear and ear canal normal.  Nose: Nose normal.  Mouth/Throat: Oropharynx is clear and moist. No oropharyngeal exudate.  Eyes: Conjunctivae and EOM are normal. Pupils are equal, round, and reactive to light. No scleral icterus.  Neck: Normal range of  motion. Neck supple. No thyromegaly present.  Cardiovascular: Normal rate, regular rhythm, normal heart sounds and intact distal pulses.   No murmur heard. Pulses:      Radial pulses are 2+ on the right side, and 2+ on the left side.  Pulmonary/Chest: Effort normal and breath sounds normal. No respiratory distress. He has no wheezes. He has no rales.  Abdominal: Soft. Bowel sounds are normal. He exhibits no distension and no mass. There is no tenderness. There is no rebound and no guarding.  Genitourinary: Rectum normal. Rectal exam shows no external hemorrhoid, no internal hemorrhoid, no fissure, no mass, no tenderness and anal tone normal. Guaiac negative stool. Prostate is enlarged (20gm). Prostate is not tender.  Musculoskeletal: Normal range of motion. He exhibits no edema.  Lymphadenopathy:    He has no cervical adenopathy.  Neurological: He is alert and oriented to person, place, and time.  CN grossly intact, station and gait intact  Skin: Skin is warm and dry. No rash noted.  Psychiatric: He has a normal mood and affect. His behavior is normal. Judgment and thought content normal.       Assessment & Plan:

## 2012-10-08 NOTE — Assessment & Plan Note (Signed)
Preventative protocols reviewed and updated unless pt declined. Discussed healthy diet and lifestyle.  Stool kit today. DRE today.  Increased bump in PSA over the last year - will recommend he return in 6 months to repeat PSA (lab visit).  Pt agrees with plan.

## 2012-10-08 NOTE — Assessment & Plan Note (Signed)
Refilled viagra

## 2012-10-08 NOTE — Patient Instructions (Signed)
I've sent in viagra refill. Pass by lab to pick up stool kit today. Return in 6 months for recheck prostate level (lab visit only). We will call you with results. Good to see you today.  Call us with qeustions.

## 2012-10-09 ENCOUNTER — Other Ambulatory Visit: Payer: Self-pay | Admitting: Family Medicine

## 2012-10-14 ENCOUNTER — Encounter: Payer: Self-pay | Admitting: Family Medicine

## 2012-10-14 ENCOUNTER — Other Ambulatory Visit: Payer: 59

## 2012-10-21 ENCOUNTER — Encounter: Payer: 59 | Admitting: Family Medicine

## 2012-11-15 ENCOUNTER — Encounter: Payer: Self-pay | Admitting: Occupational Medicine

## 2013-04-07 ENCOUNTER — Other Ambulatory Visit: Payer: Self-pay | Admitting: Family Medicine

## 2013-04-23 ENCOUNTER — Other Ambulatory Visit: Payer: 59

## 2013-08-17 ENCOUNTER — Ambulatory Visit (INDEPENDENT_AMBULATORY_CARE_PROVIDER_SITE_OTHER): Payer: 59 | Admitting: Family Medicine

## 2013-08-17 ENCOUNTER — Ambulatory Visit: Payer: 59

## 2013-08-17 VITALS — BP 118/80 | HR 88 | Temp 98.3°F | Resp 16 | Ht 72.0 in | Wt 192.0 lb

## 2013-08-17 DIAGNOSIS — R0981 Nasal congestion: Secondary | ICD-10-CM

## 2013-08-17 DIAGNOSIS — J329 Chronic sinusitis, unspecified: Secondary | ICD-10-CM

## 2013-08-17 MED ORDER — AMOXICILLIN-POT CLAVULANATE 875-125 MG PO TABS: 1.0000 | ORAL_TABLET | Freq: Two times a day (BID) | ORAL | Status: DC

## 2013-08-17 NOTE — Progress Notes (Signed)
Patient ID: Gregory Johnson MRN: [id number], DOB: 12/18/1960, 53 y.o. Date of Encounter: 08/17/2013, 2:33 PM  Primary Physician: Eustaquio BoydenJavier Gutierrez, MD  Chief Complaint:  Chief Complaint  Patient presents with  . 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 Afebrile. No chills. Nasal congestion thick and green/yellow. Sinus pressure is the worst symptom. Cough is productive secondary to post nasal drip and not associated with time of day. Ears feel full, leading to sensation of muffled hearing. Has tried OTC cold preps without success. No GI complaints.   No recent antibiotics, recent travels, or sick contacts   No leg trauma, sedentary periods, h/o cancer, or tobacco use.  Past Medical History  Diagnosis Date  . Seasonal allergies   . ED (erectile dysfunction)   . Orthostatic headache 10/24/2011    found to have T3/4 prominent lateral osteophytes on CT myelo with epidural contrast at levels below this, resolved after fluoro guided blood patch (Dr. Milinda Pointer[facility]an [facility])     Home Meds: Prior to Admission medications   Medication Sig Start Date End Date Taking? Authorizing Provider  cetirizine (ZYRTEC) 10 MG tablet Take 10 mg by mouth daily as needed.      Historical Provider, MD  VIAGRA 50 MG tablet TAKE 1 TABLET (50 MG TOTAL) BY MOUTH DAILY AS NEEDED FOR ERECTILE DYSFUNCTION. 04/07/13   Eustaquio BoydenJavier Gutierrez, MD    Allergies: No Known Allergies  History   Social History  . Marital Status: Married    Spouse Name: N/A    Number of Children: 2  . Years of Education: some coll   Occupational History  . firefighter Unemployed   Social History Main Topics  . Smoking status: Current Some Day Smoker    Types: Cigarettes  . Smokeless tobacco: Never Used  . Alcohol Use: Yes     Comment: Occasionally  . Drug Use: No  . Sexual Activity: Not on file   Other Topics  Concern  . Not on file   Social History Narrative   Caffeine: 2 cups coffee, 1 coke daily, lots of soda   Lives with wife, 2 daughters who recently graduated college, 3 dogs   Edu: some college   Occupation: IT sales professionalirefighter   Activity: walks, treadmill, started going to gym   Diet: good water, some fruits/vegetables      Review of Systems: Constitutional: negative for chills, fever, night sweats or weight changes Cardiovascular: negative for chest pain or palpitations Respiratory: negative for hemoptysis, wheezing, or shortness of breath Abdominal: negative for abdominal pain, nausea, vomiting or diarrhea Dermatological: negative for rash Neurologic: negative for headache   Physical Exam: Blood pressure 118/80, pulse 88, temperature 98.3 F (36.8 C), temperature source Oral, resp. rate 16, height 6' (1.829 m), weight 192 lb (87.091 kg), SpO2 98.00%., Body mass index is 26.03 kg/(m^2). General: Well developed, well nourished, in no acute distress. Head: Normocephalic, atraumatic, eyes without discharge, sclera non-icteric, nares are congested. Bilateral auditory canals clear, TM's are without perforation, pearly grey with reflective cone of light bilaterally. Serous effusion bilaterally behind TM's. Maxillary sinus TTP. Oral cavity moist, dentition normal. Posterior pharynx with post nasal drip and mild erythema. No peritonsillar abscess or tonsillar exudate. Neck: Supple. No thyromegaly. Full ROM. No lymphadenopathy. Lungs: Clear bilaterally to auscultation without wheezes, rales, or rhonchi. Breathing is unlabored.  Heart: RRR with S1 S2. No  murmurs, rubs, or gallops appreciated. Msk:  Strength and tone normal for age. Extremities: No clubbing or cyanosis. No edema. Neuro: Alert and oriented X 3. Moves all extremities spontaneously. CNII-XII grossly in tact. Psych:  Responds to questions appropriately with a normal affect.   [facility] reading (PRIMARY) by  Dr. Milus GlazierLauenstein:  Byrd HesselbachWaters view of  sinuses:  No fluid levels    ASSESSMENT AND PLAN:  53 y.o. year old male with sinusitis -Sinus congestion - Plan: DG SinUS 1-2 Views  augmentin 875 bid x 10 days  -Tylenol/Motrin prn -Rest/fluids -RTC precautions -RTC 3-5 days if no improvement  Signed, Elvina SidleKurt Khanh Tanori, MD 08/17/2013 2:33 PM

## 2013-08-17 NOTE — Patient Instructions (Signed)

## 2013-10-01 ENCOUNTER — Other Ambulatory Visit: Payer: Self-pay | Admitting: Family Medicine

## 2013-10-01 ENCOUNTER — Other Ambulatory Visit: Payer: 59

## 2013-10-01 DIAGNOSIS — Z125 Encounter for screening for malignant neoplasm of prostate: Secondary | ICD-10-CM

## 2013-10-01 DIAGNOSIS — D7589 Other specified diseases of blood and blood-forming organs: Secondary | ICD-10-CM

## 2013-10-01 DIAGNOSIS — Z0000 Encounter for general adult medical examination without abnormal findings: Secondary | ICD-10-CM

## 2013-10-09 ENCOUNTER — Encounter: Payer: 59 | Admitting: Family Medicine

## 2013-10-27 ENCOUNTER — Other Ambulatory Visit: Payer: Self-pay | Admitting: Family Medicine

## 2013-11-04 NOTE — Telephone Encounter (Signed)
Pt called to ck on status of refill of viagra; spoke with pharmacist at [facility] [address] and rx will be ready for pick up in 1 hr/ pt is scheduling CPX.

## 2013-12-03 ENCOUNTER — Ambulatory Visit: Payer: 59 | Admitting: Family Medicine

## 2013-12-08 ENCOUNTER — Telehealth: Payer: Self-pay | Admitting: Family Medicine

## 2013-12-08 ENCOUNTER — Ambulatory Visit (INDEPENDENT_AMBULATORY_CARE_PROVIDER_SITE_OTHER): Payer: 59 | Admitting: Family Medicine

## 2013-12-08 ENCOUNTER — Encounter: Payer: Self-pay | Admitting: Family Medicine

## 2013-12-08 VITALS — BP 114/76 | HR 76 | Temp 98.5°F | Ht 72.0 in | Wt 196.5 lb

## 2013-12-08 DIAGNOSIS — Z1211 Encounter for screening for malignant neoplasm of colon: Secondary | ICD-10-CM

## 2013-12-08 DIAGNOSIS — F4321 Adjustment disorder with depressed mood: Secondary | ICD-10-CM

## 2013-12-08 DIAGNOSIS — Z0000 Encounter for general adult medical examination without abnormal findings: Secondary | ICD-10-CM

## 2013-12-08 DIAGNOSIS — N529 Male erectile dysfunction, unspecified: Secondary | ICD-10-CM

## 2013-12-08 MED ORDER — SILDENAFIL CITRATE 100 MG PO TABS: ORAL_TABLET | ORAL | Status: DC

## 2013-12-08 NOTE — Patient Instructions (Signed)
Stool kit today. Let's watch mood - if persistent trouble in 1-2 months, let me know. Return as needed or in 1 year for next physical. Bring me copy of labwork from city. Good to see you today, call us with quesitons.

## 2013-12-08 NOTE — Progress Notes (Signed)
Pre visit review using our clinic review tool, if applicable. No additional management support is needed unless otherwise documented below in the visit note. 

## 2013-12-08 NOTE — Progress Notes (Signed)
BP 114/76  Pulse 76  Temp(Src) 98.5 F (36.9 C) (Oral)  Ht 6' (1.829 m)  Wt 196 lb 8 oz (89.132 kg)  BMI 26.64 kg/m2   CC: CPE  Subjective:    Patient ID: Gregory Johnson, male    DOB: 04/01/1961, 53 y.o.   MRN: [id number]  HPI: Gregory Johnson is a 53 y.o. 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 Will look for this at home and bring me copy. States PSA back to normal and chol levels better.  Smoking - some days usually with EtOH.   3 friends passed away recently. One from cancer. Having trouble with this. 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 This keeps him busy.  Preventative: Colon cancer screening - discussed, would like iFOB. Did not turn in last year. Encouraged to turn this in this year. Prostate screening - see above.  Tdap - 2012  Flu - gets for free.  Seat belt use discussed Sunscreen use discussed. No suspicious moles.  Caffeine: 2 cups coffee, 1 coke daily, lots of soda  Lives with wife, 2 daughters who recently graduated college, 3 dogs  Edu: some college  Occupation: IT sales professional  Activity: walks, treadmill, started going to gym  Diet: good water, some fruits/vegetables   Relevant past medical, surgical, family and social history reviewed and updated as indicated.  Allergies and medications reviewed and updated. Current Outpatient Prescriptions on File Prior to Visit  Medication Sig  . cetirizine (ZYRTEC) 10 MG tablet Take 10 mg by mouth daily as needed.     No current facility-administered medications on file prior to visit.    Review of Systems  Constitutional: Negative for fever, chills, activity change, appetite change, fatigue and unexpected weight change.  HENT: Negative for hearing loss.   Eyes:  Negative for visual disturbance.  Respiratory: Negative for cough, chest tightness, shortness of breath and wheezing.   Cardiovascular: Negative for chest pain, palpitations and leg swelling.  Gastrointestinal: Negative for nausea, vomiting, abdominal pain, diarrhea, constipation, blood in stool and abdominal distention.  Genitourinary: Negative for hematuria and difficulty urinating.  Musculoskeletal: Negative for arthralgias, myalgias and neck pain.  Skin: Negative for rash.  Neurological: Negative for dizziness, seizures, syncope and headaches.  Hematological: Negative for adenopathy. Does not bruise/bleed easily.  Psychiatric/Behavioral: Positive for dysphoric mood (recent friends' passings). The patient is not nervous/anxious.    Per HPI unless specifically indicated above    Objective:    BP 114/76  Pulse 76  Temp(Src) 98.5 F (36.9 C) (Oral)  Ht 6' (1.829 m)  Wt 196 lb 8 oz (89.132 kg)  BMI 26.64 kg/m2  Physical Exam  Nursing note and vitals reviewed. Constitutional: He is oriented to person, place, and time. He appears well-developed and well-nourished. No distress.  HENT:  Head: Normocephalic and atraumatic.  Right Ear: Hearing, tympanic membrane, external ear and ear canal normal.  Left Ear: Hearing, tympanic membrane, external ear and ear canal normal.  Nose: Nose normal.  Mouth/Throat: Uvula is midline, oropharynx is clear and moist and mucous membranes are normal. No oropharyngeal exudate, posterior oropharyngeal edema or posterior oropharyngeal erythema.  Eyes: Conjunctivae and EOM are normal. Pupils are equal, round, and reactive to light. No  scleral icterus.  Neck: Normal range of motion. Neck supple. No thyromegaly present.  Cardiovascular: Normal rate, regular rhythm, normal heart sounds and intact distal pulses.   No murmur heard. Pulses:      Radial pulses are 2+ on the right side, and 2+ on the left side.  Pulmonary/Chest: Effort normal and breath sounds  normal. No respiratory distress. He has no wheezes. He has no rales.  Abdominal: Soft. Bowel sounds are normal. He exhibits no distension and no mass. There is no tenderness. There is no rebound and no guarding.  Genitourinary: Rectum normal. Rectal exam shows no external hemorrhoid, no internal hemorrhoid, no fissure, no mass, no tenderness and anal tone normal. Prostate is enlarged (20gm). Prostate is not tender.  Musculoskeletal: Normal range of motion. He exhibits no edema.  Lymphadenopathy:    He has no cervical adenopathy.  Neurological: He is alert and oriented to person, place, and time.  CN grossly intact, station and gait intact  Skin: Skin is warm and dry. No rash noted.  Psychiatric: He has a normal mood and affect. His behavior is normal. Judgment and thought content normal.       Assessment & Plan:   Problem List Items Addressed This Visit   ED (erectile dysfunction)     viagra refilled. ?early BPH - will continue to monitor.    Healthcare maintenance - Primary     Preventative protocols reviewed and updated unless pt declined. Discussed healthy diet and lifestyle. I've asked patient to bring me copy of recent labwork from city to review.    Mourning     Discussed normal grieving process - advised notify me if sxs persist past 1-2 mo. Pt agrees with plan.     Other Visit Diagnoses   Special screening for malignant neoplasms, colon        Relevant Orders       Fecal occult blood, imunochemical        Follow up plan: Return in about 1 year (around 12/09/2014), or as needed, for annual exam, prior fasting for blood work.

## 2013-12-08 NOTE — Assessment & Plan Note (Addendum)
Preventative protocols reviewed and updated unless pt declined. Discussed healthy diet and lifestyle. I've asked patient to bring me copy of recent labwork from city to review.

## 2013-12-08 NOTE — Assessment & Plan Note (Signed)
viagra refilled. ?early BPH - will continue to monitor.

## 2013-12-08 NOTE — Telephone Encounter (Signed)
Relevant patient education mailed to patient.  

## 2013-12-08 NOTE — Assessment & Plan Note (Signed)
Discussed normal grieving process - advised notify me if sxs persist past 1-2 mo. Pt agrees with plan.

## 2014-05-14 LAB — COMPREHENSIVE METABOLIC PANEL
ALT: 18
AST: 23 U/L
Alkaline Phosphatase: 52 U/L
CREATININE: 0.99
Glucose: 95
Sodium: 140
TOTAL PROTEIN: 6.2 g/dL
Total Bilirubin: 0.6 mg/dL

## 2014-05-14 LAB — CBC
HEMOGLOBIN: 15.7 g/dL
WBC: 6.6
platelet count: 238

## 2014-05-14 LAB — LIPID PANEL
CHOLESTEROL: 171
HDL: 38
LDL (calc): 113
Triglycerides: 102

## 2014-05-14 LAB — PSA: PSA: 0.62

## 2014-09-15 ENCOUNTER — Other Ambulatory Visit: Payer: Self-pay | Admitting: Family Medicine

## 2014-10-05 ENCOUNTER — Other Ambulatory Visit: Payer: Self-pay | Admitting: Orthopedic Surgery

## 2014-10-05 DIAGNOSIS — M25511 Pain in right shoulder: Secondary | ICD-10-CM

## 2014-10-05 DIAGNOSIS — M719 Bursopathy, unspecified: Secondary | ICD-10-CM

## 2014-10-05 DIAGNOSIS — M25512 Pain in left shoulder: Principal | ICD-10-CM

## 2014-10-07 ENCOUNTER — Ambulatory Visit
Admission: RE | Admit: 2014-10-07 | Discharge: 2014-10-07 | Disposition: A | Discharge status: Home / Self Care | Payer: 59 | Source: Ambulatory Visit | Attending: Orthopedic Surgery | Admitting: Orthopedic Surgery

## 2014-10-07 DIAGNOSIS — M25512 Pain in left shoulder: Principal | ICD-10-CM

## 2014-10-07 DIAGNOSIS — M25511 Pain in right shoulder: Secondary | ICD-10-CM

## 2014-10-07 DIAGNOSIS — M719 Bursopathy, unspecified: Secondary | ICD-10-CM

## 2014-11-16 HISTORY — PX: OTHER SURGICAL HISTORY: SHX169

## 2014-12-19 ENCOUNTER — Other Ambulatory Visit: Payer: Self-pay | Admitting: Family Medicine

## 2014-12-25 NOTE — Telephone Encounter (Signed)
Pt left v/m requesting refill viagra to [facility] [address]. Left v/m for pt to ck with [facility] [address].

## 2014-12-29 ENCOUNTER — Encounter: Payer: Self-pay | Admitting: *Deleted

## 2014-12-29 ENCOUNTER — Ambulatory Visit (INDEPENDENT_AMBULATORY_CARE_PROVIDER_SITE_OTHER): Payer: 59 | Admitting: Family Medicine

## 2014-12-29 ENCOUNTER — Encounter: Payer: Self-pay | Admitting: Family Medicine

## 2014-12-29 VITALS — BP 116/70 | HR 64 | Temp 98.3°F | Ht 72.0 in | Wt 182.5 lb

## 2014-12-29 DIAGNOSIS — Z1211 Encounter for screening for malignant neoplasm of colon: Secondary | ICD-10-CM | POA: Diagnosis not present

## 2014-12-29 DIAGNOSIS — Z0000 Encounter for general adult medical examination without abnormal findings: Secondary | ICD-10-CM

## 2014-12-29 DIAGNOSIS — R634 Abnormal weight loss: Secondary | ICD-10-CM

## 2014-12-29 DIAGNOSIS — G4700 Insomnia, unspecified: Secondary | ICD-10-CM

## 2014-12-29 LAB — POC HEMOCCULT BLD/STL (OFFICE/1-CARD/DIAGNOSTIC)
Card #1 Date: 9
Fecal Occult Blood, POC: NEGATIVE

## 2014-12-29 NOTE — Progress Notes (Signed)
Pre visit review using our clinic review tool, if applicable. No additional management support is needed unless otherwise documented below in the visit note. 

## 2014-12-29 NOTE — Patient Instructions (Addendum)
Bring me copy of labwork.  Start trial of benadryl $RemoveBef'25mg'NcZZnodWBh$  nightly for sleep Pass by lab to pick up stool kit.  If continued weight loss, let me know and return for labs.   Health Maintenance A healthy lifestyle and preventative care can promote health and wellness.  Maintain regular health, dental, and eye exams.  Eat a healthy diet. Foods like vegetables, fruits, whole grains, low-fat dairy products, and lean protein foods contain the nutrients you need and are low in calories. Decrease your intake of foods high in solid fats, added sugars, and salt. Get information about a proper diet from your health care provider, if necessary.  Regular physical exercise is one of the most important things you can do for your health. Most adults should get at least 150 minutes of moderate-intensity exercise (any activity that increases your heart rate and causes you to sweat) each week. In addition, most adults need muscle-strengthening exercises on 2 or more days a week.   Maintain a healthy weight. The body mass index (BMI) is a screening tool to identify possible weight problems. It provides an estimate of body fat based on height and weight. Your health care provider can find your BMI and can help you achieve or maintain a healthy weight. For males 20 years and older:  A BMI below 18.5 is considered underweight.  A BMI of 18.5 to 24.9 is normal.  A BMI of 25 to 29.9 is considered overweight.  A BMI of 30 and above is considered obese.  Maintain normal blood lipids and cholesterol by exercising and minimizing your intake of saturated fat. Eat a balanced diet with plenty of fruits and vegetables. Blood tests for lipids and cholesterol should begin at age 63 and be repeated every 5 years. If your lipid or cholesterol levels are high, you are over age 68, or you are at high risk for heart disease, you may need your cholesterol levels checked more frequently.Ongoing high lipid and cholesterol levels should  be treated with medicines if diet and exercise are not working.  If you smoke, find out from your health care provider how to quit. If you do not use tobacco, do not start.  Lung cancer screening is recommended for adults aged 73-80 years who are at high risk for developing lung cancer because of a history of smoking. A yearly low-dose CT scan of the lungs is recommended for people who have at least a 30-pack-year history of smoking and are current smokers or have quit within the past 15 years. A pack year of smoking is smoking an average of 1 pack of cigarettes a day for 1 year (for example, a 30-pack-year history of smoking could mean smoking 1 pack a day for 30 years or 2 packs a day for 15 years). Yearly screening should continue until the smoker has stopped smoking for at least 15 years. Yearly screening should be stopped for people who develop a health problem that would prevent them from having lung cancer treatment.  If you choose to drink alcohol, do not have more than 2 drinks per day. One drink is considered to be 12 oz (360 mL) of beer, 5 oz (150 mL) of wine, or 1.5 oz (45 mL) of liquor.  Avoid the use of street drugs. Do not share needles with anyone. Ask for help if you need support or instructions about stopping the use of drugs.  High blood pressure causes heart disease and increases the risk of stroke. Blood pressure should be  checked at least every 1-2 years. Ongoing high blood pressure should be treated with medicines if weight loss and exercise are not effective.  If you are 77-59 years old, ask your health care provider if you should take aspirin to prevent heart disease.  Diabetes screening involves taking a blood sample to check your fasting blood sugar level. This should be done once every 3 years after age 3 if you are at a normal weight and without risk factors for diabetes. Testing should be considered at a younger age or be carried out more frequently if you are overweight  and have at least 1 risk factor for diabetes.  Colorectal cancer can be detected and often prevented. Most routine colorectal cancer screening begins at the age of 74 and continues through age 43. However, your health care provider may recommend screening at an earlier age if you have risk factors for colon cancer. On a yearly basis, your health care provider may provide home test kits to check for hidden blood in the stool. A small camera at the end of a tube may be used to directly examine the colon (sigmoidoscopy or colonoscopy) to detect the earliest forms of colorectal cancer. Talk to your health care provider about this at age 76 when routine screening begins. A direct exam of the colon should be repeated every 5-10 years through age 62, unless early forms of precancerous polyps or small growths are found.  People who are at an increased risk for hepatitis B should be screened for this virus. You are considered at high risk for hepatitis B if:  You were born in a country where hepatitis B occurs often. Talk with your health care provider about which countries are considered high risk.  Your parents were born in a high-risk country and you have not received a shot to protect against hepatitis B (hepatitis B vaccine).  You have HIV or AIDS.  You use needles to inject street drugs.  You live with, or have sex with, someone who has hepatitis B.  You are a man who has sex with other men (MSM).  You get hemodialysis treatment.  You take certain medicines for conditions like cancer, organ transplantation, and autoimmune conditions.  Hepatitis C blood testing is recommended for all people born from [birth year] through 1965 and any individual with known risk factors for hepatitis C.  Healthy men should no longer receive prostate-specific antigen (PSA) blood tests as part of routine cancer screening. Talk to your health care provider about prostate cancer screening.  Testicular cancer screening is not  recommended for adolescents or adult males who have no symptoms. Screening includes self-exam, a health care provider exam, and other screening tests. Consult with your health care provider about any symptoms you have or any concerns you have about testicular cancer.  Practice safe sex. Use condoms and avoid high-risk sexual practices to reduce the spread of sexually transmitted infections (STIs).  You should be screened for STIs, including gonorrhea and chlamydia if:  You are sexually active and are younger than 24 years.  You are older than 24 years, and your health care provider tells you that you are at risk for this type of infection.  Your sexual activity has changed since you were last screened, and you are at an increased risk for chlamydia or gonorrhea. Ask your health care provider if you are at risk.  If you are at risk of being infected with HIV, it is recommended that you take a prescription medicine  daily to prevent HIV infection. This is called pre-exposure prophylaxis (PrEP). You are considered at risk if:  You are a man who has sex with other men (MSM).  You are a heterosexual man who is sexually active with multiple partners.  You take drugs by injection.  You are sexually active with a partner who has HIV.  Talk with your health care provider about whether you are at high risk of being infected with HIV. If you choose to begin PrEP, you should first be tested for HIV. You should then be tested every 3 months for as long as you are taking PrEP.  Use sunscreen. Apply sunscreen liberally and repeatedly throughout the day. You should seek shade when your shadow is shorter than you. Protect yourself by wearing long sleeves, pants, a wide-brimmed hat, and sunglasses year round whenever you are outdoors.  Tell your health care provider of new moles or changes in moles, especially if there is a change in shape or color. Also, tell your health care provider if a mole is larger  than the size of a pencil eraser.  A one-time screening for abdominal aortic aneurysm (AAA) and surgical repair of large AAAs by ultrasound is recommended for men aged 62-75 years who are current or former smokers.  Stay current with your vaccines (immunizations). Document Released: 09/30/2007 Document Revised: 04/08/2013 Document Reviewed: 08/29/2010 [facility] Patient Information 2015 [place], [place]. This information is not intended to replace advice given to you by your health care provider. Make sure you discuss any questions you have with your health care provider.

## 2014-12-29 NOTE — Progress Notes (Signed)
BP 116/70 mmHg  Pulse 64  Temp(Src) 98.3 F (36.8 C) (Oral)  Ht 6' (1.829 m)  Wt 182 lb 8 oz (82.781 kg)  BMI 24.75 kg/m2   CC: CPE  Subjective:    Patient ID: Gregory Johnson, male    DOB: [birth year], 54 y.o.   MRN: [id number]  HPI: Gregory Johnson is a 54 y.o. male presenting on 12/29/2014 for Annual Exam   R shoulder repair (labrum, biceps) by [facility] 5 wks ago. Still recovering. Pending L side.   Insomnia - trouble sleeping at night time trouble shutting off mind. Ongoing for 2 months. Some sleep maintenance insomnia due to shoulder pain.   Labwork done with city, did not bring copy. Has at home, will bring [place] copy. PSA normal at work per patient.   Smoking - minimal - usually when drinking.   Preventative: Colon cancer screening - discussed, would like iFOB. Did not turn in last 2 years. Again encouraged to turn this in this year. Prostate screening - would like yearly screen Flu - gets for free at work Tdap - 2012 2016 Seat belt use discussed  Sunscreen use discussed. No suspicious moles.   Caffeine: 2 cups coffee, 1 coke daily, lots of soda  Lives with wife, 2 daughters who recently graduated college, 3 dogs  Edu: some college  Occupation: IT sales professional  Activity: walks, treadmill, started going to gym  Diet: good water, some fruits/vegetables   Relevant past medical, surgical, family and social history reviewed and updated as indicated. Interim medical history since our last visit reviewed. Allergies and medications reviewed and updated. Current Outpatient Prescriptions on File Prior to Visit  Medication Sig  . cetirizine (ZYRTEC) 10 MG tablet Take 10 mg by mouth daily as needed.    Marland Kitchen VIAGRA 100 MG tablet TAKE 1 TABLET BY MOUTH DAILY AS NEEDED FOR ERECTILE DYSFUNCTION   No current facility-administered medications on file prior to visit.    Review of Systems  Constitutional: Negative for fever, chills, activity change, appetite change,  fatigue and unexpected weight change (weight loss noted, recent shoulder surgeery).       No night sweats  HENT: Negative for hearing loss.   Eyes: Negative for visual disturbance.  Respiratory: Negative for cough, chest tightness, shortness of breath and wheezing.   Cardiovascular: Negative for chest pain, palpitations and leg swelling.  Gastrointestinal: Negative for nausea, vomiting, abdominal pain, diarrhea, constipation, blood in stool and abdominal distention.  Genitourinary: Negative for hematuria and difficulty urinating.  Musculoskeletal: Negative for myalgias, arthralgias and neck pain.  Skin: Negative for rash.  Neurological: Negative for dizziness, seizures, syncope and headaches.  Hematological: Negative for adenopathy. Does not bruise/bleed easily.  Psychiatric/Behavioral: Negative for dysphoric mood. The patient is not nervous/anxious.    Per HPI unless specifically indicated above     Objective:    BP 116/70 mmHg  Pulse 64  Temp(Src) 98.3 F (36.8 C) (Oral)  Ht 6' (1.829 m)  Wt 182 lb 8 oz (82.781 kg)  BMI 24.75 kg/m2  Wt Readings from Last 3 Encounters:  12/29/14 182 lb 8 oz (82.781 kg)  10/07/14 185 lb (83.915 kg)  12/08/13 196 lb 8 oz (89.132 kg)    Physical Exam  Constitutional: He is oriented to person, place, and time. He appears well-developed and well-nourished. No distress.  HENT:  Head: Normocephalic and atraumatic.  Right Ear: Hearing, tympanic membrane, external ear and ear canal normal.  Left Ear: Hearing, tympanic membrane, external ear and ear  canal normal.  Nose: Nose normal.  Mouth/Throat: Uvula is midline, oropharynx is clear and moist and mucous membranes are normal. No oropharyngeal exudate, posterior oropharyngeal edema or posterior oropharyngeal erythema.  Eyes: Conjunctivae and EOM are normal. Pupils are equal, round, and reactive to light. No scleral icterus.  Neck: Normal range of motion. Neck supple. No thyromegaly present.    Cardiovascular: Normal rate, regular rhythm, normal heart sounds and intact distal pulses.   No murmur heard. Pulses:      Radial pulses are 2+ on the right side, and 2+ on the left side.  Pulmonary/Chest: Effort normal and breath sounds normal. No respiratory distress. He has no wheezes. He has no rales.  Abdominal: Soft. Bowel sounds are normal. He exhibits no distension and no mass. There is no tenderness. There is no rebound and no guarding.  Genitourinary: Prostate normal. Rectal exam shows external hemorrhoid (noninflamed). Rectal exam shows no internal hemorrhoid, no fissure, no mass, no tenderness and anal tone normal. Guaiac negative stool. Prostate is not enlarged (20gm) and not tender.  Musculoskeletal: Normal range of motion. He exhibits no edema.  Lymphadenopathy:    He has no cervical adenopathy.  Neurological: He is alert and oriented to person, place, and time.  CN grossly intact, station and gait intact  Skin: Skin is warm and dry. No rash noted.  Psychiatric: He has a normal mood and affect. His behavior is normal. Judgment and thought content normal.  Nursing note and vitals reviewed.     Assessment & Plan:  Hep C sceen next labs - discussed Problem List Items Addressed This Visit    Healthcare maintenance - Primary    Preventative protocols reviewed and updated unless pt declined. Discussed healthy diet and lifestyle.  Pt did not bring any recent labwork or return last few year's stool kits. Advised needs to bring records to review, encouraged he return this year's stool kit. In office guaiac card negative.      Insomnia    Discussed options - pt will start with benadryl $RemoveBefor'25mg'DxeJINiibmBL$  nightly.      Loss of weight    Discussed with patient - attributes to recent surgery. Advised bring recent labs for me to review. If persistent weight loss, recommend notify us right away for further labwork. Pt agrees with plan.       Other Visit Diagnoses    Special screening for  malignant neoplasms, colon        Relevant Orders    Fecal occult blood, imunochemical    POC Hemoccult Bld/Stl (1-Cd Office Dx) (Completed)        Follow up plan: Return in about 6 months (around 06/28/2015), or as needed, for follow up visit.

## 2014-12-29 NOTE — Assessment & Plan Note (Addendum)
Preventative protocols reviewed and updated unless pt declined. Discussed healthy diet and lifestyle.  Pt did not bring any recent labwork or return last few year's stool kits. Advised needs to bring records to review, encouraged he return this year's stool kit. In office guaiac card negative.

## 2014-12-29 NOTE — Assessment & Plan Note (Signed)
Discussed with patient - attributes to recent surgery. Advised bring recent labs for me to review. If persistent weight loss, recommend notify us right away for further labwork. Pt agrees with plan.

## 2014-12-29 NOTE — Assessment & Plan Note (Signed)
Discussed options - pt will start with benadryl  nightly.

## 2015-02-18 ENCOUNTER — Telehealth: Payer: Self-pay | Admitting: Family Medicine

## 2015-02-18 NOTE — Telephone Encounter (Signed)
Pt dropped off labwork for Dr. Sharen HonesGutierrez. Pt asked if you could please give him a call Kim. Lab work in Dr. Nicanor AlconGutierrez's RX in-box.  CB # [phone]

## 2015-02-18 NOTE — Telephone Encounter (Signed)
In your IN box for review.  

## 2015-02-23 NOTE — Telephone Encounter (Signed)
I did. He just wanted to make sure I received the labwork.

## 2015-02-23 NOTE — Telephone Encounter (Signed)
Reviewed labs and in Kim's box to input and scan. Nothing alarming. Looks like pt asked to speak with kim last week - did we touch base with him?

## 2015-03-17 ENCOUNTER — Encounter: Payer: Self-pay | Admitting: *Deleted

## 2015-03-18 ENCOUNTER — Other Ambulatory Visit: Payer: Self-pay | Admitting: Family Medicine

## 2015-04-21 ENCOUNTER — Other Ambulatory Visit: Payer: 59

## 2015-04-21 DIAGNOSIS — Z1211 Encounter for screening for malignant neoplasm of colon: Secondary | ICD-10-CM

## 2015-04-21 LAB — FECAL OCCULT BLOOD, GUAIAC: Fecal Occult Blood: NEGATIVE

## 2015-04-21 LAB — FECAL OCCULT BLOOD, IMMUNOCHEMICAL: Fecal Occult Bld: NEGATIVE

## 2015-04-22 ENCOUNTER — Encounter: Payer: Self-pay | Admitting: *Deleted

## 2015-07-30 ENCOUNTER — Emergency Department (HOSPITAL_COMMUNITY)
Admission: EM | Admit: 2015-07-30 | Discharge: 2015-07-30 | Disposition: A | Discharge status: Home / Self Care | Payer: 59 | Attending: Emergency Medicine | Admitting: Emergency Medicine

## 2015-07-30 ENCOUNTER — Encounter (HOSPITAL_COMMUNITY): Payer: Self-pay | Admitting: *Deleted

## 2015-07-30 DIAGNOSIS — L299 Pruritus, unspecified: Secondary | ICD-10-CM | POA: Diagnosis present

## 2015-07-30 DIAGNOSIS — F17210 Nicotine dependence, cigarettes, uncomplicated: Secondary | ICD-10-CM | POA: Insufficient documentation

## 2015-07-30 DIAGNOSIS — L509 Urticaria, unspecified: Secondary | ICD-10-CM | POA: Insufficient documentation

## 2015-07-30 NOTE — ED Notes (Signed)
Pt states that he woke up an hour ago with hives and itching; pt states that he ate spaghetti tonight and hasn't ate it in a long time; pt denies difficulty breathing or swallowing at present; pt states "I just want a shot"

## 2015-07-30 NOTE — ED Notes (Signed)
Pt states "I am going to just go get some Benadryl from the [facility]. I can't wait anymore"

## 2016-02-02 ENCOUNTER — Encounter: Payer: Self-pay | Admitting: Podiatry

## 2016-02-02 ENCOUNTER — Ambulatory Visit (INDEPENDENT_AMBULATORY_CARE_PROVIDER_SITE_OTHER): Payer: 59

## 2016-02-02 ENCOUNTER — Ambulatory Visit (INDEPENDENT_AMBULATORY_CARE_PROVIDER_SITE_OTHER): Payer: 59 | Admitting: Podiatry

## 2016-02-02 VITALS — BP 140/93 | HR 60 | Resp 16

## 2016-02-02 DIAGNOSIS — M7751 Other enthesopathy of right foot: Secondary | ICD-10-CM

## 2016-02-02 DIAGNOSIS — M79671 Pain in right foot: Secondary | ICD-10-CM

## 2016-02-02 MED ORDER — IBUPROFEN-FAMOTIDINE 800-26.6 MG PO TABS: 1.0000 | ORAL_TABLET | Freq: Three times a day (TID) | ORAL | 1 refills | Status: DC

## 2016-02-02 NOTE — Progress Notes (Signed)
   Subjective:    Patient ID: Gregory Johnson, male    DOB: 02/25/1961, 55 y.o.   MRN: [id number]  HPI    Review of Systems  All other systems reviewed and are negative.      Objective:   Physical Exam        Assessment & Plan:

## 2016-02-06 MED ORDER — BETAMETHASONE SOD PHOS & ACET 6 (3-3) MG/ML IJ SUSP: 3.0000 mg | Freq: Once | INTRAMUSCULAR | Status: DC

## 2016-02-06 NOTE — Progress Notes (Signed)
Patient ID: Gregory Johnson, male   DOB: 01/21/1961, 55 y.o.   MRN: [id number] Subjective:  Patient presents today with pain and tenderness to the right foot. Patient states that he's had the pain for several weeks now. Patient presents today for further treatment and evaluation.    Objective/Physical Exam General: The patient is alert and oriented x3 in no acute distress.  Dermatology: Skin is warm, dry and supple bilateral lower extremities. Negative for open lesions or macerations.  Vascular: Palpable pedal pulses bilaterally. No edema or erythema noted. Capillary refill within normal limits.  Neurological: Epicritic and protective threshold grossly intact bilaterally.   Musculoskeletal Exam: Pain on palpation and range of motion to the third MPJ right foot. Range of motion within normal limits to all pedal and ankle joints bilateral. Muscle strength 5/5 in all groups bilateral.   Radiographic Exam:  Normal osseous mineralization. Joint spaces preserved. No fracture/dislocation/boney destruction.    Assessment: #1 capsulitis third MPJ right foot. #2 pain in third toe right foot.   Plan of Care:  #1 Patient was evaluated. #2 injection 0.5 mL Celestone Soluspan injected in the patient's third MPJ right foot. #3. Recommend good supportive shoe gear. Discussed conservative modalities to improve and alleviate symptoms including offloading of the forefoot. #4 patient is to return to clinic in 4 weeks   Dr. Felecia ShellingBrent M. Zaya Kessenich, DPM [facility]

## 2016-02-28 ENCOUNTER — Ambulatory Visit (INDEPENDENT_AMBULATORY_CARE_PROVIDER_SITE_OTHER): Payer: 59 | Admitting: Podiatry

## 2016-02-28 ENCOUNTER — Encounter: Payer: Self-pay | Admitting: Podiatry

## 2016-02-28 DIAGNOSIS — M79671 Pain in right foot: Secondary | ICD-10-CM

## 2016-02-28 DIAGNOSIS — M7751 Other enthesopathy of right foot: Secondary | ICD-10-CM

## 2016-02-28 MED ORDER — BETAMETHASONE SOD PHOS & ACET 6 (3-3) MG/ML IJ SUSP: 3.0000 mg | Freq: Once | INTRAMUSCULAR | Status: DC

## 2016-02-28 MED ORDER — IBUPROFEN-FAMOTIDINE 800-26.6 MG PO TABS: 1.0000 | ORAL_TABLET | Freq: Three times a day (TID) | ORAL | 1 refills | Status: DC

## 2016-02-28 NOTE — Progress Notes (Signed)
Patient ID: Gregory Johnson, male   DOB: 03/26/1961, 55 y.o.   MRN: [id number] Subjective:  Patient presents today for follow-up evaluation of capsulitis third MPJ right foot. Patient states that he feels tremendously better. Patient only notices intermittent minimal pain at times.   Objective/Physical Exam General: The patient is alert and oriented x3 in no acute distress.  Dermatology: Skin is warm, dry and supple bilateral lower extremities. Negative for open lesions or macerations.  Vascular: Palpable pedal pulses bilaterally. No edema or erythema noted. Capillary refill within normal limits.  Neurological: Epicritic and protective threshold grossly intact bilaterally.   Musculoskeletal Exam: Minimal pain on palpation and range of motion to the third MPJ right foot. Range of motion within normal limits to all pedal and ankle joints bilateral. Muscle strength 5/5 in all groups bilateral.   Radiographic Exam:  Normal osseous mineralization. Joint spaces preserved. No fracture/dislocation/boney destruction.    Assessment: #1 capsulitis third MPJ right foot. #2 pain in third toe right foot.   Plan of Care:  #1 Patient was evaluated. #2 injection 0.5 mL Celestone Soluspan injected in the patient's third MPJ right foot. #3. Continue good supportive shoe gear. Discussed conservative modalities to improve and alleviate symptoms including offloading of the forefoot. #4 refill prescription for Duexis was dispensed today  #5 return to clinic when necessary   Dr. Felecia ShellingBrent M. Moniqua Engebretsen, DPM [facility]

## 2016-02-28 NOTE — Addendum Note (Signed)
Addended by: Renaldo ReelPARRY, MELODY A on: 02/28/2016 05:37 PM   Modules accepted: Orders

## 2016-05-17 ENCOUNTER — Ambulatory Visit (INDEPENDENT_AMBULATORY_CARE_PROVIDER_SITE_OTHER): Payer: 59 | Admitting: Family Medicine

## 2016-05-17 ENCOUNTER — Encounter: Payer: Self-pay | Admitting: Family Medicine

## 2016-05-17 VITALS — BP 104/66 | HR 66 | Temp 98.5°F | Ht 71.75 in | Wt 194.5 lb

## 2016-05-17 DIAGNOSIS — N529 Male erectile dysfunction, unspecified: Secondary | ICD-10-CM | POA: Diagnosis not present

## 2016-05-17 DIAGNOSIS — M1991 Primary osteoarthritis, unspecified site: Secondary | ICD-10-CM | POA: Diagnosis not present

## 2016-05-17 DIAGNOSIS — F17200 Nicotine dependence, unspecified, uncomplicated: Secondary | ICD-10-CM | POA: Diagnosis not present

## 2016-05-17 DIAGNOSIS — Z0000 Encounter for general adult medical examination without abnormal findings: Secondary | ICD-10-CM

## 2016-05-17 DIAGNOSIS — M1990 Unspecified osteoarthritis, unspecified site: Secondary | ICD-10-CM | POA: Insufficient documentation

## 2016-05-17 DIAGNOSIS — Z1211 Encounter for screening for malignant neoplasm of colon: Secondary | ICD-10-CM

## 2016-05-17 MED ORDER — SILDENAFIL CITRATE 20 MG PO TABS: 40.0000 mg | ORAL_TABLET | Freq: Every day | ORAL | 3 refills | Status: DC | PRN

## 2016-05-17 MED ORDER — BUPROPION HCL 75 MG PO TABS: 75.0000 mg | ORAL_TABLET | Freq: Two times a day (BID) | ORAL | 2 refills | Status: DC

## 2016-05-17 NOTE — Assessment & Plan Note (Addendum)
1 ppd. New grandson on the way - ready to quit. Discussed options. Would like to try wellbutrin + NRT nicoderm CQ. Discussed side effects of wellbutrin, reviewed dosing of nicoderm.

## 2016-05-17 NOTE — Assessment & Plan Note (Signed)
Preventative protocols reviewed and updated unless pt declined. Discussed healthy diet and lifestyle.  

## 2016-05-17 NOTE — Patient Instructions (Addendum)
Pass by lab to pick up stool kit.  Try wellbutrin for smoking cessation - 1 tablet twice daily.  Try nicotine patch 51mg for 1 week then decrease to 7635m for 1 week then cut in half to 3.35m44mpatch for 1 week.  Bring me copy of your upcoming blood work Return as needed or in 1 year for next physical.   Health Maintenance, Male A healthy lifestyle and preventative care can promote health and wellness.  Maintain regular health, dental, and eye exams.  Eat a healthy diet. Foods like vegetables, fruits, whole grains, low-fat dairy products, and lean protein foods contain the nutrients you need and are low in calories. Decrease your intake of foods high in solid fats, added sugars, and salt. Get information about a proper diet from your health care provider, if necessary.  Regular physical exercise is one of the most important things you can do for your health. Most adults should get at least 150 minutes of moderate-intensity exercise (any activity that increases your heart rate and causes you to sweat) each week. In addition, most adults need muscle-strengthening exercises on 2 or more days a week.   Maintain a healthy weight. The body mass index (BMI) is a screening tool to identify possible weight problems. It provides an estimate of body fat based on height and weight. Your health care provider can find your BMI and can help you achieve or maintain a healthy weight. For males 20 years and older:  A BMI below 18.5 is considered underweight.  A BMI of 18.5 to 24.9 is normal.  A BMI of 25 to 29.9 is considered overweight.  A BMI of 30 and above is considered obese.  Maintain normal blood lipids and cholesterol by exercising and minimizing your intake of saturated fat. Eat a balanced diet with plenty of fruits and vegetables. Blood tests for lipids and cholesterol should begin at age 77 67d be repeated every 5 years. If your lipid or cholesterol levels are high, you are over age 37,64r you are  at high risk for heart disease, you may need your cholesterol levels checked more frequently.Ongoing high lipid and cholesterol levels should be treated with medicines if diet and exercise are not working.  If you smoke, find out from your health care provider how to quit. If you do not use tobacco, do not start.  Lung cancer screening is recommended for adults aged 55-83-80ars who are at high risk for developing lung cancer because of a history of smoking. A yearly low-dose CT scan of the lungs is recommended for people who have at least a 30-pack-year history of smoking and are current smokers or have quit within the past 15 years. A pack year of smoking is smoking an average of 1 pack of cigarettes a day for 1 year (for example, a 30-pack-year history of smoking could mean smoking 1 pack a day for 30 years or 2 packs a day for 15 years). Yearly screening should continue until the smoker has stopped smoking for at least 15 years. Yearly screening should be stopped for people who develop a health problem that would prevent them from having lung cancer treatment.  If you choose to drink alcohol, do not have more than 2 drinks per day. One drink is considered to be 12 oz (360 mL) of beer, 5 oz (150 mL) of wine, or 1.5 oz (45 mL) of liquor.  Avoid the use of street drugs. Do not share needles with anyone. Ask for  help if you need support or instructions about stopping the use of drugs.  High blood pressure causes heart disease and increases the risk of stroke. High blood pressure is more likely to develop in:  People who have blood pressure in the end of the normal range (100-139/85-89 mm Hg).  People who are overweight or obese.  People who are African American.  If you are 48-65 years of age, have your blood pressure checked every 3-5 years. If you are 57 years of age or older, have your blood pressure checked every year. You should have your blood pressure measured twice-once when you are at a  hospital or clinic, and once when you are not at a hospital or clinic. Record the average of the two measurements. To check your blood pressure when you are not at a hospital or clinic, you can use:  An automated blood pressure machine at a pharmacy.  A home blood pressure monitor.  If you are 30-11 years old, ask your health care provider if you should take aspirin to prevent heart disease.  Diabetes screening involves taking a blood sample to check your fasting blood sugar level. This should be done once every 3 years after age 91 if you are at a normal weight and without risk factors for diabetes. Testing should be considered at a younger age or be carried out more frequently if you are overweight and have at least 1 risk factor for diabetes.  Colorectal cancer can be detected and often prevented. Most routine colorectal cancer screening begins at the age of 31 and continues through age 39. However, your health care provider may recommend screening at an earlier age if you have risk factors for colon cancer. On a yearly basis, your health care provider may provide home test kits to check for hidden blood in the stool. A small camera at the end of a tube may be used to directly examine the colon (sigmoidoscopy or colonoscopy) to detect the earliest forms of colorectal cancer. Talk to your health care provider about this at age 33 when routine screening begins. A direct exam of the colon should be repeated every 5-10 years through age 64, unless early forms of precancerous polyps or small growths are found.  People who are at an increased risk for hepatitis B should be screened for this virus. You are considered at high risk for hepatitis B if:  You were born in a country where hepatitis B occurs often. Talk with your health care provider about which countries are considered high risk.  Your parents were born in a high-risk country and you have not received a shot to protect against hepatitis B  (hepatitis B vaccine).  You have HIV or AIDS.  You use needles to inject street drugs.  You live with, or have sex with, someone who has hepatitis B.  You are a man who has sex with other men (MSM).  You get hemodialysis treatment.  You take certain medicines for conditions like cancer, organ transplantation, and autoimmune conditions.  Hepatitis C blood testing is recommended for all people born from [birth year] through 1965 and any individual with known risk factors for hepatitis C.  Healthy men should no longer receive prostate-specific antigen (PSA) blood tests as part of routine cancer screening. Talk to your health care provider about prostate cancer screening.  Testicular cancer screening is not recommended for adolescents or adult males who have no symptoms. Screening includes self-exam, a health care provider exam, and other screening  tests. Consult with your health care provider about any symptoms you have or any concerns you have about testicular cancer.  Practice safe sex. Use condoms and avoid high-risk sexual practices to reduce the spread of sexually transmitted infections (STIs).  You should be screened for STIs, including gonorrhea and chlamydia if:  You are sexually active and are younger than 24 years.  You are older than 24 years, and your health care provider tells you that you are at risk for this type of infection.  Your sexual activity has changed since you were last screened, and you are at an increased risk for chlamydia or gonorrhea. Ask your health care provider if you are at risk.  If you are at risk of being infected with HIV, it is recommended that you take a prescription medicine daily to prevent HIV infection. This is called pre-exposure prophylaxis (PrEP). You are considered at risk if:  You are a man who has sex with other men (MSM).  You are a heterosexual man who is sexually active with multiple partners.  You take drugs by injection.  You are  sexually active with a partner who has HIV.  Talk with your health care provider about whether you are at high risk of being infected with HIV. If you choose to begin PrEP, you should first be tested for HIV. You should then be tested every 3 months for as long as you are taking PrEP.  Use sunscreen. Apply sunscreen liberally and repeatedly throughout the day. You should seek shade when your shadow is shorter than you. Protect yourself by wearing long sleeves, pants, a wide-brimmed hat, and sunglasses year round whenever you are outdoors.  Tell your health care provider of new moles or changes in moles, especially if there is a change in shape or color. Also, tell your health care provider if a mole is larger than the size of a pencil eraser.  A one-time screening for abdominal aortic aneurysm (AAA) and surgical repair of large AAAs by ultrasound is recommended for men aged 10-75 years who are current or former smokers.  Stay current with your vaccines (immunizations). This information is not intended to replace advice given to you by your health care provider. Make sure you discuss any questions you have with your health care provider. Document Released: 09/30/2007 Document Revised: 04/24/2014 Document Reviewed: 01/05/2015 Elsevier Interactive Patient Education  2017 [employer].

## 2016-05-17 NOTE — Assessment & Plan Note (Signed)
Trial generic sildenafil 20mg 

## 2016-05-17 NOTE — Progress Notes (Signed)
BP 104/66 (BP Location: Left Arm, Patient Position: Sitting, Cuff Size: Normal)   Pulse 66   Temp 98.5 F (36.9 C) (Oral)   Ht 5' 11.75" (1.822 m)   Wt 194 lb 8 oz (88.2 kg)   SpO2 94%   BMI 26.56 kg/m    CC: CPE Subjective:    Patient ID: Gregory Johnson, male    DOB: 09/17/1960, 56 y.o.   MRN: [id number]  HPI: Gregory ShutterLarry W Nowotny is a 56 y.o. 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  New grandson planned 06/2016. UTD T[employer] at work - asked to bring us copy.  Asks us to prescribe duexis if needed.   Preventative: Colon cancer screening - iFOB last year negative.  Prostate screening - would like yearly screen  Flu - gets for free at work Tdap - 2016 Seat belt use discussed  Sunscreen use discussed. No changing moles.  Smoking - 1ppd. New grandson - desires to quit.  Alcohol - socially  Caffeine: 2 cups coffee, 1 coke daily, lots of soda  Lives with wife, 2 daughters who recently graduated college, 3 dogs  Edu: some college  Occupation: IT sales professionalirefighter  Activity: walks, treadmill, gym - walks 5 mi/day at station  Diet: good water, some fruits/vegetables   Relevant past medical, surgical, family and social history reviewed and updated as indicated. Interim medical history since our last visit reviewed. Allergies and medications reviewed and updated. Current Outpatient Prescriptions on File Prior to Visit  Medication Sig  . Ibuprofen-Famotidine (DUEXIS) 800-26.6 MG TABS Take 1 tablet by mouth 3 (three) times daily.   Current Facility-Administered Medications on File Prior to Visit  Medication  . betamethasone acetate-betamethasone sodium phosphate (CELESTONE) injection 3 mg  . betamethasone acetate-betamethasone sodium phosphate (CELESTONE) injection 3 mg    Review of Systems  Constitutional: Negative for activity change, appetite change, chills, fatigue, fever and unexpected weight change.  HENT: Negative for  hearing loss.   Eyes: Negative for visual disturbance.  Respiratory: Negative for cough, chest tightness, shortness of breath and wheezing.   Cardiovascular: Negative for chest pain, palpitations and leg swelling.  Gastrointestinal: Negative for abdominal distention, abdominal pain, blood in stool, constipation, diarrhea, nausea and vomiting.  Genitourinary: Negative for difficulty urinating and hematuria.  Musculoskeletal: Negative for arthralgias, myalgias and neck pain.  Skin: Negative for rash.  Neurological: Negative for dizziness, seizures, syncope and headaches.  Hematological: Negative for adenopathy. Does not bruise/bleed easily.  Psychiatric/Behavioral: Negative for dysphoric mood. The patient is not nervous/anxious.    Per HPI unless specifically indicated in ROS section     Objective:    BP 104/66 (BP Location: Left Arm, Patient Position: Sitting, Cuff Size: Normal)   Pulse 66   Temp 98.5 F (36.9 C) (Oral)   Ht 5' 11.75" (1.822 m)   Wt 194 lb 8 oz (88.2 kg)   SpO2 94%   BMI 26.56 kg/m   Wt Readings from Last 3 Encounters:  05/17/16 194 lb 8 oz (88.2 kg)  12/29/14 182 lb 8 oz (82.8 kg)  10/07/14 185 lb (83.9 kg)    Physical Exam  Constitutional: He is oriented to person, place, and time. He appears well-developed and well-nourished. No distress.  HENT:  Head: Normocephalic and atraumatic.  Right Ear: Hearing, tympanic membrane, external ear and ear canal normal.  Left Ear: Hearing, tympanic membrane, external ear and ear canal normal.  Nose: Nose normal.  Mouth/Throat: Uvula is midline, oropharynx  is clear and moist and mucous membranes are normal. No oropharyngeal exudate, posterior oropharyngeal edema or posterior oropharyngeal erythema.  Eyes: Conjunctivae and EOM are normal. Pupils are equal, round, and reactive to light. No scleral icterus.  Neck: Normal range of motion. Neck supple. No thyromegaly present.  Cardiovascular: Normal rate, regular rhythm, normal  heart sounds and intact distal pulses.   No murmur heard. Pulses:      Radial pulses are 2+ on the right side, and 2+ on the left side.  Pulmonary/Chest: Effort normal and breath sounds normal. No respiratory distress. He has no wheezes. He has no rales.  Abdominal: Soft. Bowel sounds are normal. He exhibits no distension and no mass. There is no tenderness. There is no rebound and no guarding.  Genitourinary: Rectum normal. Rectal exam shows no external hemorrhoid, no internal hemorrhoid, no fissure, no mass, no tenderness and anal tone normal. Prostate is enlarged (25gm mild enlargement). Prostate is not tender.  Musculoskeletal: Normal range of motion. He exhibits no edema.  Lymphadenopathy:    He has no cervical adenopathy.  Neurological: He is alert and oriented to person, place, and time.  CN grossly intact, station and gait intact  Skin: Skin is warm and dry. No rash noted.  Psychiatric: He has a normal mood and affect. His behavior is normal. Judgment and thought content normal.  Nursing note and vitals reviewed.  Results for orders placed or performed in visit on 04/22/15  Fecal Occult Blood, Guaiac  Result Value Ref Range   Fecal Occult Blood Negative       Assessment & Plan:   Problem List Items Addressed This Visit    ED (erectile dysfunction)    Trial generic sildenafil 20mg        Healthcare maintenance - Primary    Preventative protocols reviewed and updated unless pt declined. Discussed healthy diet and lifestyle.       Osteoarthritis    Doing well with PRN Duexis. Requests we refill if needed.       Smoker    1 ppd. New grandson on the way - ready to quit. Discussed options. Would like to try wellbutrin + NRT nicoderm CQ. Discussed side effects of wellbutrin, reviewed dosing of nicoderm.        Other Visit Diagnoses    Special screening for malignant neoplasms, colon       Relevant Orders   Fecal occult blood, imunochemical       Follow up  plan: Return in about 1 year (around 05/17/2017) for annual exam, prior fasting for blood work.  Eustaquio Boyden, MD

## 2016-05-17 NOTE — Assessment & Plan Note (Signed)
Doing well with PRN Duexis. Requests we refill if needed.

## 2016-05-17 NOTE — Progress Notes (Signed)
Pre visit review using our clinic review tool, if applicable. No additional management support is needed unless otherwise documented below in the visit note. 

## 2016-05-24 LAB — COMPREHENSIVE METABOLIC PANEL
ALT: 15
AST: 16 U/L
Alkaline Phosphatase: 53 U/L
Creat: 0.97
GLUCOSE: 94
POTASSIUM: 4.6 mmol/L
SODIUM: 140
Total Bilirubin: 0.8 mg/dL

## 2016-05-24 LAB — CBC
HGB: 15.1 g/dL
PLATELET COUNT: 203
WBC: 6.3

## 2016-05-24 LAB — LIPID PANEL
Cholesterol: 179
HDL: 34
LDL (calc): 121
TRIGLYCERIDES: 121

## 2016-05-24 LAB — PSA: PSA: 0.7

## 2016-06-16 ENCOUNTER — Other Ambulatory Visit (INDEPENDENT_AMBULATORY_CARE_PROVIDER_SITE_OTHER): Payer: 59

## 2016-06-16 DIAGNOSIS — Z1211 Encounter for screening for malignant neoplasm of colon: Secondary | ICD-10-CM | POA: Diagnosis not present

## 2016-06-16 LAB — FECAL OCCULT BLOOD, GUAIAC: Fecal Occult Blood: NEGATIVE

## 2016-06-16 LAB — FECAL OCCULT BLOOD, IMMUNOCHEMICAL: Fecal Occult Bld: NEGATIVE

## 2016-06-19 ENCOUNTER — Encounter: Payer: Self-pay | Admitting: *Deleted

## 2016-06-29 ENCOUNTER — Encounter: Payer: Self-pay | Admitting: *Deleted

## 2017-04-04 ENCOUNTER — Other Ambulatory Visit: Payer: Self-pay | Admitting: Family Medicine

## 2017-04-04 MED ORDER — SILDENAFIL CITRATE 20 MG PO TABS: 40.0000 mg | ORAL_TABLET | Freq: Every day | ORAL | 0 refills | Status: DC | PRN

## 2017-04-04 NOTE — Telephone Encounter (Signed)
Duexis last filled #90 x 1 by Gala LewandowskyBrent Evans; last annual 05/17/16.

## 2017-04-04 NOTE — Telephone Encounter (Signed)
Pt asking if Duexis could be refilled by Dr. Sharen HonesGutierrez. Pt would like for medication to be sent to [facility], [address], [place], [id number] [phone])Pt also requesting a return call to [phone], if it is possible to refill the medication.

## 2017-04-06 MED ORDER — IBUPROFEN-FAMOTIDINE 800-26.6 MG PO TABS: 1.0000 | ORAL_TABLET | Freq: Three times a day (TID) | ORAL | 1 refills | Status: DC

## 2017-04-06 NOTE — Telephone Encounter (Signed)
plz notify this was sent in. 

## 2017-04-06 NOTE — Telephone Encounter (Signed)
Spoke with pt notifying him rx was sent in.  Says ok and expresses his thanks for Dr. Reece AgarG doing that for him.

## 2017-04-18 ENCOUNTER — Encounter: Payer: Self-pay | Admitting: Family Medicine

## 2017-04-18 ENCOUNTER — Ambulatory Visit (INDEPENDENT_AMBULATORY_CARE_PROVIDER_SITE_OTHER): Payer: 59 | Admitting: Family Medicine

## 2017-04-18 VITALS — BP 120/76 | HR 79 | Temp 98.1°F | Ht 71.5 in | Wt 191.5 lb

## 2017-04-18 DIAGNOSIS — Z0000 Encounter for general adult medical examination without abnormal findings: Secondary | ICD-10-CM

## 2017-04-18 DIAGNOSIS — Z1211 Encounter for screening for malignant neoplasm of colon: Secondary | ICD-10-CM | POA: Diagnosis not present

## 2017-04-18 DIAGNOSIS — M47812 Spondylosis without myelopathy or radiculopathy, cervical region: Secondary | ICD-10-CM | POA: Diagnosis not present

## 2017-04-18 DIAGNOSIS — F17200 Nicotine dependence, unspecified, uncomplicated: Secondary | ICD-10-CM

## 2017-04-18 NOTE — Patient Instructions (Addendum)
Pass by lab to pick up stool kit. Work on quitting smoking with wellbutrin - let us know how you do. Continue regular aerobic exercise.  Return as needed or in 1 year for next physical exam.   Health Maintenance, Male A healthy lifestyle and preventive care is important for your health and wellness. Ask your health care provider about what schedule of regular examinations is right for you. What should I know about weight and diet? Eat a Healthy Diet  Eat plenty of vegetables, fruits, whole grains, low-fat dairy products, and lean protein.  Do not eat a lot of foods high in solid fats, added sugars, or salt.  Maintain a Healthy Weight Regular exercise can help you achieve or maintain a healthy weight. You should:  Do at least 150 minutes of exercise each week. The exercise should increase your heart rate and make you sweat (moderate-intensity exercise).  Do strength-training exercises at least twice a week.  Watch Your Levels of Cholesterol and Blood Lipids  Have your blood tested for lipids and cholesterol every 5 years starting at 57 years of age. If you are at high risk for heart disease, you should start having your blood tested when you are 57 years old. You may need to have your cholesterol levels checked more often if: ? Your lipid or cholesterol levels are high. ? You are older than 57 years of age. ? You are at high risk for heart disease.  What should I know about cancer screening? Many types of cancers can be detected early and may often be prevented. Lung Cancer  You should be screened every year for lung cancer if: ? You are a current smoker who has smoked for at least 30 years. ? You are a former smoker who has quit within the past 15 years.  Talk to your health care provider about your screening options, when you should start screening, and how often you should be screened.  Colorectal Cancer  Routine colorectal cancer screening usually begins at 57 years of age  and should be repeated every 5-10 years until you are 57 years old. You may need to be screened more often if early forms of precancerous polyps or small growths are found. Your health care provider may recommend screening at an earlier age if you have risk factors for colon cancer.  Your health care provider may recommend using home test kits to check for hidden blood in the stool.  A small camera at the end of a tube can be used to examine your colon (sigmoidoscopy or colonoscopy). This checks for the earliest forms of colorectal cancer.  Prostate and Testicular Cancer  Depending on your age and overall health, your health care provider may do certain tests to screen for prostate and testicular cancer.  Talk to your health care provider about any symptoms or concerns you have about testicular or prostate cancer.  Skin Cancer  Check your skin from head to toe regularly.  Tell your health care provider about any new moles or changes in moles, especially if: ? There is a change in a mole's size, shape, or color. ? You have a mole that is larger than a pencil eraser.  Always use sunscreen. Apply sunscreen liberally and repeat throughout the day.  Protect yourself by wearing long sleeves, pants, a wide-brimmed hat, and sunglasses when outside.  What should I know about heart disease, diabetes, and high blood pressure?  If you are 4-46 years of age, have your blood pressure  checked every 3-5 years. If you are 32 years of age or older, have your blood pressure checked every year. You should have your blood pressure measured twice-once when you are at a hospital or clinic, and once when you are not at a hospital or clinic. Record the average of the two measurements. To check your blood pressure when you are not at a hospital or clinic, you can use: ? An automated blood pressure machine at a pharmacy. ? A home blood pressure monitor.  Talk to your health care provider about your target blood  pressure.  If you are between 24-80 years old, ask your health care provider if you should take aspirin to prevent heart disease.  Have regular diabetes screenings by checking your fasting blood sugar level. ? If you are at a normal weight and have a low risk for diabetes, have this test once every three years after the age of 67. ? If you are overweight and have a high risk for diabetes, consider being tested at a younger age or more often.  A one-time screening for abdominal aortic aneurysm (AAA) by ultrasound is recommended for men aged 28-75 years who are current or former smokers. What should I know about preventing infection? Hepatitis B If you have a higher risk for hepatitis B, you should be screened for this virus. Talk with your health care provider to find out if you are at risk for hepatitis B infection. Hepatitis C Blood testing is recommended for:  Everyone born from [birth year] through 1965.  Anyone with known risk factors for hepatitis C.  Sexually Transmitted Diseases (STDs)  You should be screened each year for STDs including gonorrhea and chlamydia if: ? You are sexually active and are younger than 57 years of age. ? You are older than 57 years of age and your health care provider tells you that you are at risk for this type of infection. ? Your sexual activity has changed since you were last screened and you are at an increased risk for chlamydia or gonorrhea. Ask your health care provider if you are at risk.  Talk with your health care provider about whether you are at high risk of being infected with HIV. Your health care provider may recommend a prescription medicine to help prevent HIV infection.  What else can I do?  Schedule regular health, dental, and eye exams.  Stay current with your vaccines (immunizations).  Do not use any tobacco products, such as cigarettes, chewing tobacco, and e-cigarettes. If you need help quitting, ask your health care  provider.  Limit alcohol intake to no more than 2 drinks per day. One drink equals 12 ounces of beer, 5 ounces of wine, or 1 ounces of hard liquor.  Do not use street drugs.  Do not share needles.  Ask your health care provider for help if you need support or information about quitting drugs.  Tell your health care provider if you often feel depressed.  Tell your health care provider if you have ever been abused or do not feel safe at home. This information is not intended to replace advice given to you by your health care provider. Make sure you discuss any questions you have with your health care provider. Document Released: 09/30/2007 Document Revised: 12/01/2015 Document Reviewed: 01/05/2015 Elsevier Interactive Patient Education  [employer].

## 2017-04-18 NOTE — Assessment & Plan Note (Signed)
1ppd. Continue to encourage cessation. Action phase - has started wellbutrin today. Discussed wellbutrin vs chantix. He will update me with effect.

## 2017-04-18 NOTE — Assessment & Plan Note (Signed)
Continue duexis daily. May need to split components

## 2017-04-18 NOTE — Assessment & Plan Note (Signed)
Preventative protocols reviewed and updated unless pt declined. Discussed healthy diet and lifestyle.  

## 2017-04-18 NOTE — Progress Notes (Signed)
BP 120/76 (BP Location: Left Arm, Patient Position: Sitting, Cuff Size: Normal)   Pulse 79   Temp 98.1 F (36.7 C) (Oral)   Ht 5' 11.5" (1.816 m)   Wt 191 lb 8 oz (86.9 kg)   SpO2 97%   BMI 26.34 kg/m    CC: CPE Subjective:    Patient ID: Gregory Johnson, male    DOB: 04/19/1960, 57 y.o.   MRN: [id number]  HPI: Gregory Johnson is a 57 y.o. male presenting on 04/18/2017 for Annual Exam   Last CPE 05/17/2016. Pt states insurance allows one physical per calendar year.  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   Labs at work planned 05/2017 - asked to bring [place] copy.  Duexis for arthritis - takes one a day - insurance may not cover so he uses coupon. May need individual components  Preventative: Colon cancer screening - iFOB yearly  Prostate screening - would like yearly screen  Flu - gets for free at work Tdap 2012, Td 2016 Seat belt use discussed  Sunscreen use discussed. No changing moles.  Smoking - 1ppd. New grandson - desires to quit. To start wellbutrin today. ~20 PY hx Alcohol - socially  Caffeine: 2 cups coffee, 1 coke daily, lots of soda  Lives with wife, 2 daughters who recently graduated college, 3 dogs  Edu: some college  Occupation: IT sales professional  Activity: walks, treadmill, gym - walks 3 mi/day at station  Diet: good water, some fruits/vegetables   Relevant past medical, surgical, family and social history reviewed and updated as indicated. Interim medical history since our last visit reviewed. Allergies and medications reviewed and updated. Outpatient Medications Prior to Visit  Medication Sig Dispense Refill  . buPROPion (WELLBUTRIN) 75 MG tablet Take 1 tablet (75 mg total) by mouth 2 (two) times daily. 60 tablet 2  . Ibuprofen-Famotidine (DUEXIS) 800-26.6 MG TABS Take 1 tablet by mouth 3 (three) times daily. 90 tablet 1  . sildenafil (REVATIO) 20 MG tablet Take 2-5 tablets (40-100 mg total) by mouth daily as needed  (relations). 30 tablet 0   Facility-Administered Medications Prior to Visit  Medication Dose Route Frequency Provider Last Rate Last Dose  . betamethasone acetate-betamethasone sodium phosphate (CELESTONE) injection 3 mg  3 mg Intramuscular Once Gala Lewandowsky M, DPM      . betamethasone acetate-betamethasone sodium phosphate (CELESTONE) injection 3 mg  3 mg Intramuscular Once Felecia Shelling, DPM         Per HPI unless specifically indicated in ROS section below Review of Systems  Constitutional: Negative for activity change, appetite change, chills, fatigue, fever and unexpected weight change.  HENT: Positive for congestion (am congestion). Negative for hearing loss.   Eyes: Negative for visual disturbance.  Respiratory: Negative for cough, chest tightness, shortness of breath and wheezing.   Cardiovascular: Negative for chest pain, palpitations and leg swelling.  Gastrointestinal: Negative for abdominal distention, abdominal pain, blood in stool, constipation, diarrhea, nausea and vomiting.  Genitourinary: Negative for difficulty urinating and hematuria.  Musculoskeletal: Negative for arthralgias, myalgias and neck pain.  Skin: Negative for rash.  Neurological: Negative for dizziness, seizures, syncope and headaches.  Hematological: Negative for adenopathy. Does not bruise/bleed easily.  Psychiatric/Behavioral: Negative for dysphoric mood. The patient is not nervous/anxious.        Objective:    BP 120/76 (BP Location: Left Arm, Patient Position: Sitting, Cuff Size: Normal)   Pulse 79   Temp 98.1 F (36.7 C) (Oral)  Ht 5' 11.5" (1.816 m)   Wt 191 lb 8 oz (86.9 kg)   SpO2 97%   BMI 26.34 kg/m   Wt Readings from Last 3 Encounters:  04/18/17 191 lb 8 oz (86.9 kg)  05/17/16 194 lb 8 oz (88.2 kg)  12/29/14 182 lb 8 oz (82.8 kg)    Physical Exam  Constitutional: He is oriented to person, place, and time. He appears well-developed and well-nourished. No distress.  HENT:  Head:  Normocephalic and atraumatic.  Right Ear: Hearing, tympanic membrane, external ear and ear canal normal.  Left Ear: Hearing, tympanic membrane, external ear and ear canal normal.  Nose: Nose normal.  Mouth/Throat: Uvula is midline, oropharynx is clear and moist and mucous membranes are normal. No oropharyngeal exudate, posterior oropharyngeal edema or posterior oropharyngeal erythema.  Eyes: Conjunctivae and EOM are normal. Pupils are equal, round, and reactive to light. No scleral icterus.  Neck: Normal range of motion. Neck supple. No thyromegaly present.  Mild L thyroid fullness without nodule  Cardiovascular: Normal rate, regular rhythm, normal heart sounds and intact distal pulses.  No murmur heard. Pulses:      Radial pulses are 2+ on the right side, and 2+ on the left side.  Pulmonary/Chest: Effort normal and breath sounds normal. No respiratory distress. He has no wheezes. He has no rales.  Abdominal: Soft. Bowel sounds are normal. He exhibits no distension and no mass. There is no tenderness. There is no rebound and no guarding.  Genitourinary: Rectum normal and prostate normal. Rectal exam shows no external hemorrhoid, no internal hemorrhoid, no fissure, no mass, no tenderness and anal tone normal. Prostate is not enlarged (20gm) and not tender.  Genitourinary Comments: Mild swelling R prostate lobe  Musculoskeletal: Normal range of motion. He exhibits no edema.  Lymphadenopathy:    He has no cervical adenopathy.  Neurological: He is alert and oriented to person, place, and time.  CN grossly intact, station and gait intact  Skin: Skin is warm and dry. No rash noted.  Psychiatric: He has a normal mood and affect. His behavior is normal. Judgment and thought content normal.  Nursing note and vitals reviewed.  Results for orders placed or performed in visit on 06/29/16  Comprehensive metabolic panel  Result Value Ref Range   Total Bilirubin 0.8 mg/dL   Alkaline Phosphatase 53 U/L     AST 16 U/L   ALT 15    Sodium 140    Potassium 4.6 mmol/L   Glucose 94    Creat 0.97   Lipid panel  Result Value Ref Range   Cholesterol 179    Triglycerides 121    HDL 34    LDL (calc) 121   PSA  Result Value Ref Range   PSA 0.7   CBC  Result Value Ref Range   WBC 6.3    HGB 15.1 g/dL   platelet count 161       Assessment & Plan:   Problem List Items Addressed This Visit    Healthcare maintenance - Primary    Preventative protocols reviewed and updated unless pt declined. Discussed healthy diet and lifestyle.       Osteoarthritis    Continue duexis daily. May need to split components      Smoker    1ppd. Continue to encourage cessation. Action phase - has started wellbutrin today. Discussed wellbutrin vs chantix. He will update me with effect.        Other Visit Diagnoses  Special screening for malignant neoplasms, colon       Relevant Orders   Fecal occult blood, imunochemical       Follow up plan: Return in about 1 year (around 04/18/2018) for annual exam, prior fasting for blood work.  Eustaquio BoydenJavier Laney Bagshaw, MD

## 2017-04-19 ENCOUNTER — Telehealth: Payer: Self-pay | Admitting: Family Medicine

## 2017-04-19 NOTE — Telephone Encounter (Signed)
plz note - dx is osteoarthritis. He has tried individual components in the past, combo is more convenient.

## 2017-04-19 NOTE — Telephone Encounter (Signed)
Copied from CRM #[id number]. Topic: Quick Communication - See Telephone Encounter >> Apr 19, 2017 11:55 AM Arlyss Gandyichardson, Naileah Karg N, NT wrote: CRM for notification. See Telephone encounter for: [facility] needing to know pts diagnosis and what other medications pt has tried for the rx for Duexis. CB#: [phone]   04/19/17.

## 2017-04-20 NOTE — Telephone Encounter (Signed)
[facility] per Dr. Reece AgarG.

## 2017-05-09 ENCOUNTER — Encounter: Payer: Self-pay | Admitting: Family Medicine

## 2017-05-09 ENCOUNTER — Other Ambulatory Visit (INDEPENDENT_AMBULATORY_CARE_PROVIDER_SITE_OTHER): Payer: 59

## 2017-05-09 DIAGNOSIS — Z1211 Encounter for screening for malignant neoplasm of colon: Secondary | ICD-10-CM | POA: Diagnosis not present

## 2017-05-09 LAB — FECAL OCCULT BLOOD, GUAIAC: Fecal Occult Blood: NEGATIVE

## 2017-05-09 LAB — FECAL OCCULT BLOOD, IMMUNOCHEMICAL: FECAL OCCULT BLD: NEGATIVE

## 2017-06-01 LAB — HEPATIC FUNCTION PANEL
ALK PHOS: 57 (ref 25–125)
ALT: 16 (ref 10–40)
AST: 18 (ref 14–40)
Bilirubin, Total: 0.8

## 2017-06-01 LAB — BASIC METABOLIC PANEL
Creatinine: 1.1 (ref 0.6–1.3)
Glucose: 102
Potassium: 4.7 (ref 3.4–5.3)

## 2017-06-01 LAB — CBC AND DIFFERENTIAL: HEMOGLOBIN: 15.8 (ref 13.5–17.5)

## 2017-06-01 LAB — PSA: PSA: 0.7

## 2017-06-01 LAB — LIPID PANEL
Cholesterol: 208 — AB (ref 0–200)
HDL: 35 (ref 35–70)
LDL CALC: 152
TRIGLYCERIDES: 100 (ref 40–160)

## 2017-07-16 ENCOUNTER — Encounter: Payer: Self-pay | Admitting: Family Medicine

## 2017-07-16 DIAGNOSIS — E785 Hyperlipidemia, unspecified: Secondary | ICD-10-CM | POA: Insufficient documentation

## 2017-08-20 ENCOUNTER — Other Ambulatory Visit: Payer: Self-pay | Admitting: Family Medicine

## 2017-09-04 ENCOUNTER — Telehealth: Payer: Self-pay | Admitting: Family Medicine

## 2017-09-04 MED ORDER — IBUPROFEN-FAMOTIDINE 800-26.6 MG PO TABS: 1.0000 | ORAL_TABLET | Freq: Three times a day (TID) | ORAL | 3 refills | Status: DC

## 2017-09-04 NOTE — Telephone Encounter (Signed)
Copied from CRM [id number]. Topic: Quick Communication - Rx Refill/Question >> Sep 04, 2017  2:46 PM Zada Girt, Lumin L wrote: Medication: Ibuprofen-Famotidine (DUEXIS) 800-26.6 MG TABS   Has the patient contacted their pharmacy? Yes.   (Agent: If no, request that the patient contact the pharmacy for the refill.) (Agent: If yes, when and what did the pharmacy advise?)  Preferred Pharmacy (with phone number or street name): [facility] [place], [place], [place] - [address] [place] [address] Phone: [phone] Fax: [phone]  Agent: Please be advised that RX refills may take up to 3 business days. We ask that you follow-up with your pharmacy.  The pharmacy is trying to do a PA for the script and they need clinical information to complete it. Please call back.

## 2017-09-04 NOTE — Telephone Encounter (Signed)
Returned call to [employer] and spoke with [place].  I asked about a PA for med.  Says he does not see where anyone from there location called [place].  And he is not aware rx needing a PA.  Last rx:  03/17/17, #90 Last OV (CPE):  04/18/17 Next OV:  none

## 2017-09-04 NOTE — Telephone Encounter (Signed)
sent in

## 2017-09-06 ENCOUNTER — Telehealth: Payer: Self-pay

## 2017-09-06 NOTE — Telephone Encounter (Signed)
Received PA form for Duexis.  Placed in Dr. Timoteo Expose box.

## 2017-09-06 NOTE — Telephone Encounter (Addendum)
Left message on vm per dpr asking pt to call back letting us know if he has tried any other meds before the Duexis.  PA form is in basket on [employer] pending call back from pt.

## 2017-09-06 NOTE — Telephone Encounter (Signed)
Filled and in Lisa's box.  Would call pt to see if he's tried other meds prior to duexis. Update form if he has. Will send but may not be covered if he's not tried other meds.

## 2017-09-07 NOTE — Telephone Encounter (Signed)
Left message on vm per dpr asking pt to call back letting us know if he has tried any other meds before the Duexis.

## 2017-09-11 NOTE — Telephone Encounter (Addendum)
Left message on vm per dpr asking pt to call back letting us know if he has tried any other meds before the Duexis.  Mailing a letter.

## 2017-09-25 ENCOUNTER — Telehealth: Payer: Self-pay | Admitting: Family Medicine

## 2017-09-25 NOTE — Telephone Encounter (Signed)
Per previous CRM [phone] I called the patient to verify if he has tried anything else other than Duexis / Per patient he has tried several prescriptions of Meloxicam, and did not see any improvement.   / Patient is requesting a Prior auth for [employer]

## 2017-09-25 NOTE — Telephone Encounter (Signed)
See TE, 09/25/17.

## 2017-09-25 NOTE — Telephone Encounter (Signed)
Noted. Documented info and faxed PA. Decision pending.

## 2017-09-25 NOTE — Telephone Encounter (Signed)
Copied from CRM [phone]. Topic: Inquiry >> Sep 25, 2017  9:30 AM Stephannie LiSimmons, Jackqulyn Mendel L, NT wrote: Reason for EAV:[id number]:Patient called to follow up on previous call regarding message left concerning Deuxis, please call him at [phone]

## 2018-03-25 ENCOUNTER — Other Ambulatory Visit: Payer: Self-pay | Admitting: Family Medicine

## 2018-03-25 NOTE — Telephone Encounter (Signed)
Electronic refill request Sildenafil Last office visit 04/18/17 Last refill 04/04/17 #30

## 2018-05-08 ENCOUNTER — Other Ambulatory Visit: Payer: Self-pay | Admitting: Nurse Practitioner

## 2018-05-08 ENCOUNTER — Ambulatory Visit
Admission: RE | Admit: 2018-05-08 | Discharge: 2018-05-08 | Disposition: A | Discharge status: Home / Self Care | Payer: No Typology Code available for payment source | Source: Ambulatory Visit | Attending: Nurse Practitioner | Admitting: Nurse Practitioner

## 2018-05-08 DIAGNOSIS — Z0389 Encounter for observation for other suspected diseases and conditions ruled out: Secondary | ICD-10-CM | POA: Diagnosis not present

## 2018-05-08 DIAGNOSIS — Z0000 Encounter for general adult medical examination without abnormal findings: Secondary | ICD-10-CM

## 2018-09-03 ENCOUNTER — Other Ambulatory Visit: Payer: Self-pay | Admitting: Family Medicine

## 2018-09-03 NOTE — Telephone Encounter (Signed)
Last office visit 04/18/2017 for CPE.  Last refilled 09/04/2017 for #90 with 3 refills.  No future appointments.

## 2018-12-14 ENCOUNTER — Other Ambulatory Visit: Payer: Self-pay | Admitting: Family Medicine

## 2018-12-16 NOTE — Telephone Encounter (Signed)
Patient is overdue for CPE.

## 2019-02-10 IMAGING — CR DG CHEST 2V
2 series · 2 of 2 positions shown · non-contrast
Comparison: None.

CLINICAL DATA: [employer] fire exit exam.  No symptoms.

EXAM:
CHEST - 2 VIEW

[w chest pa]
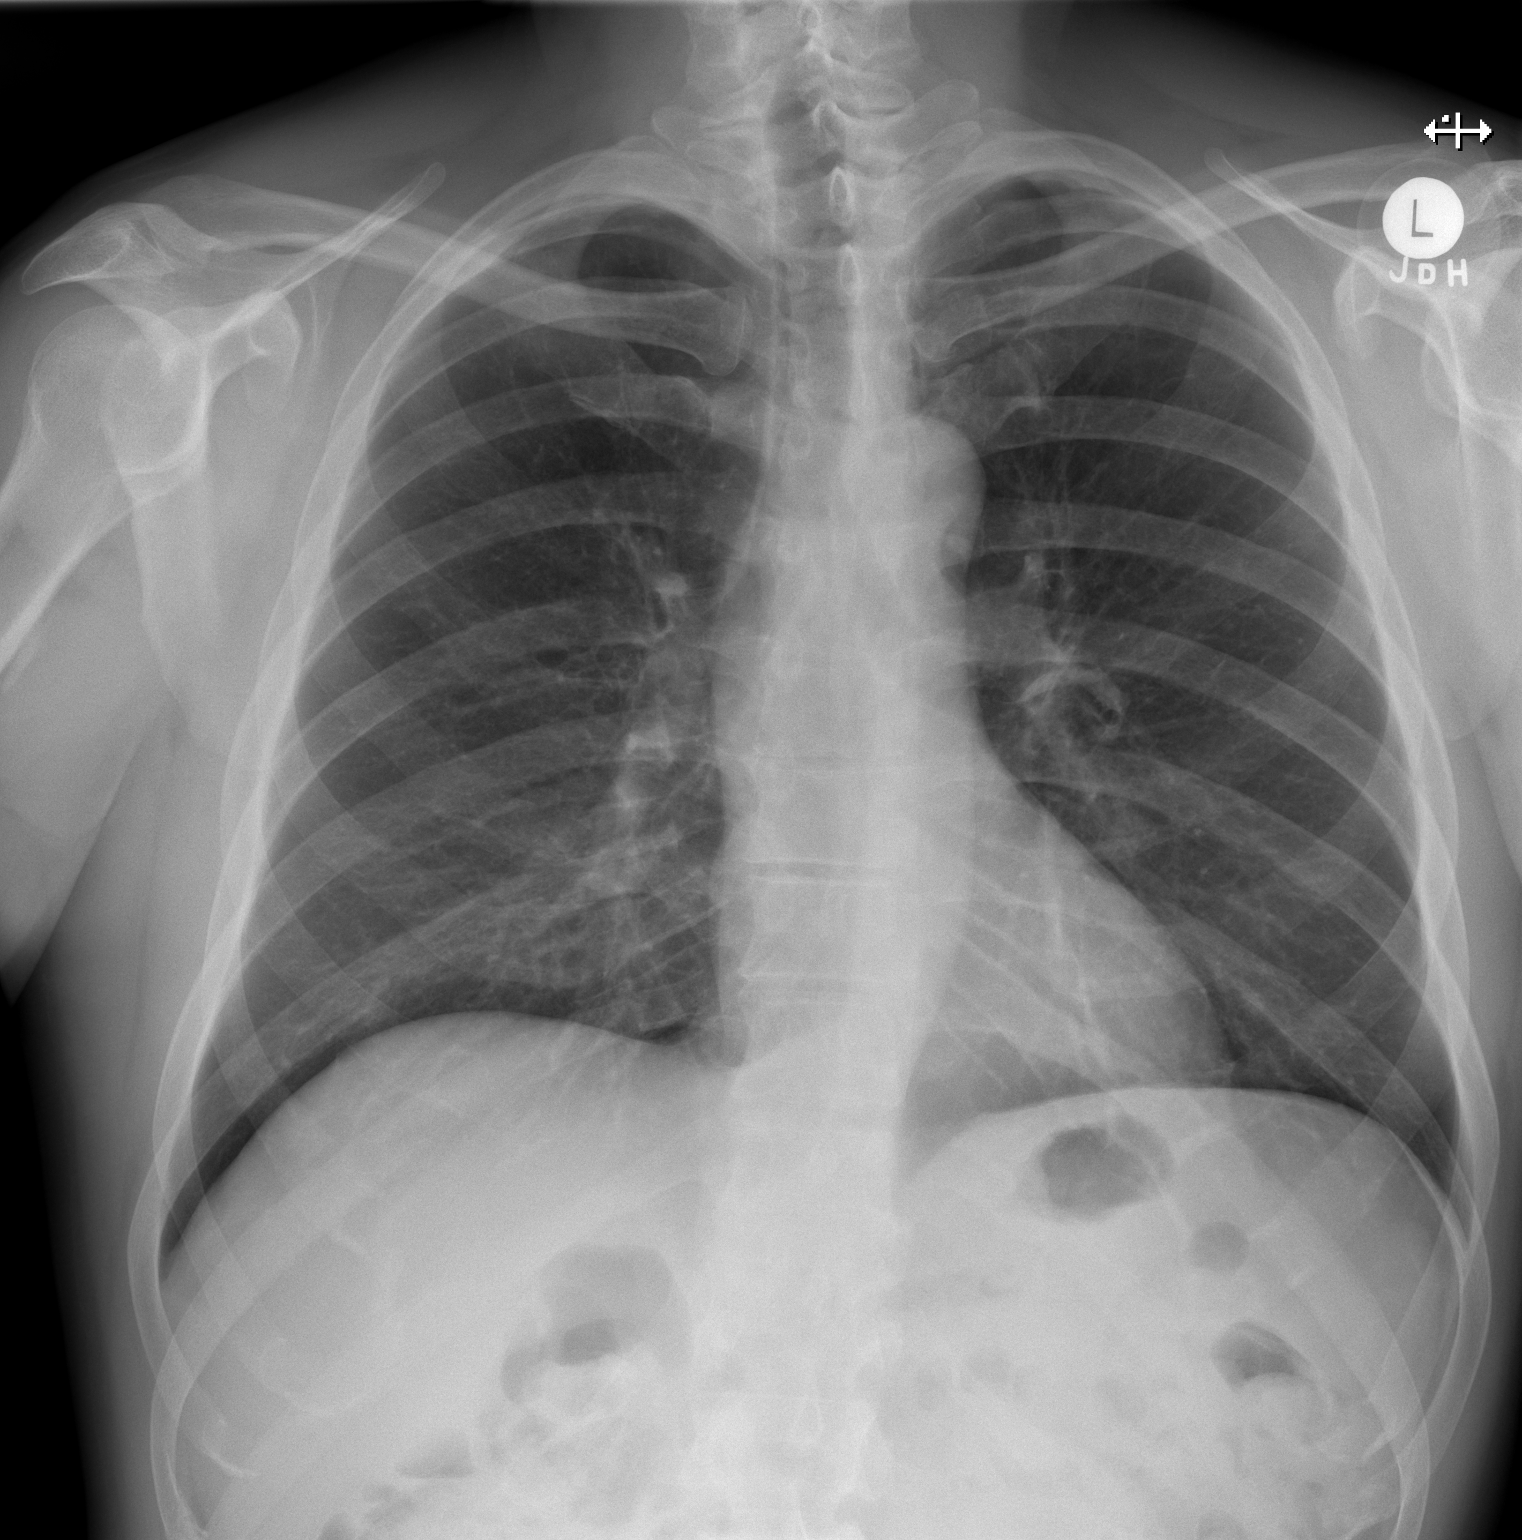

[w chest lat]
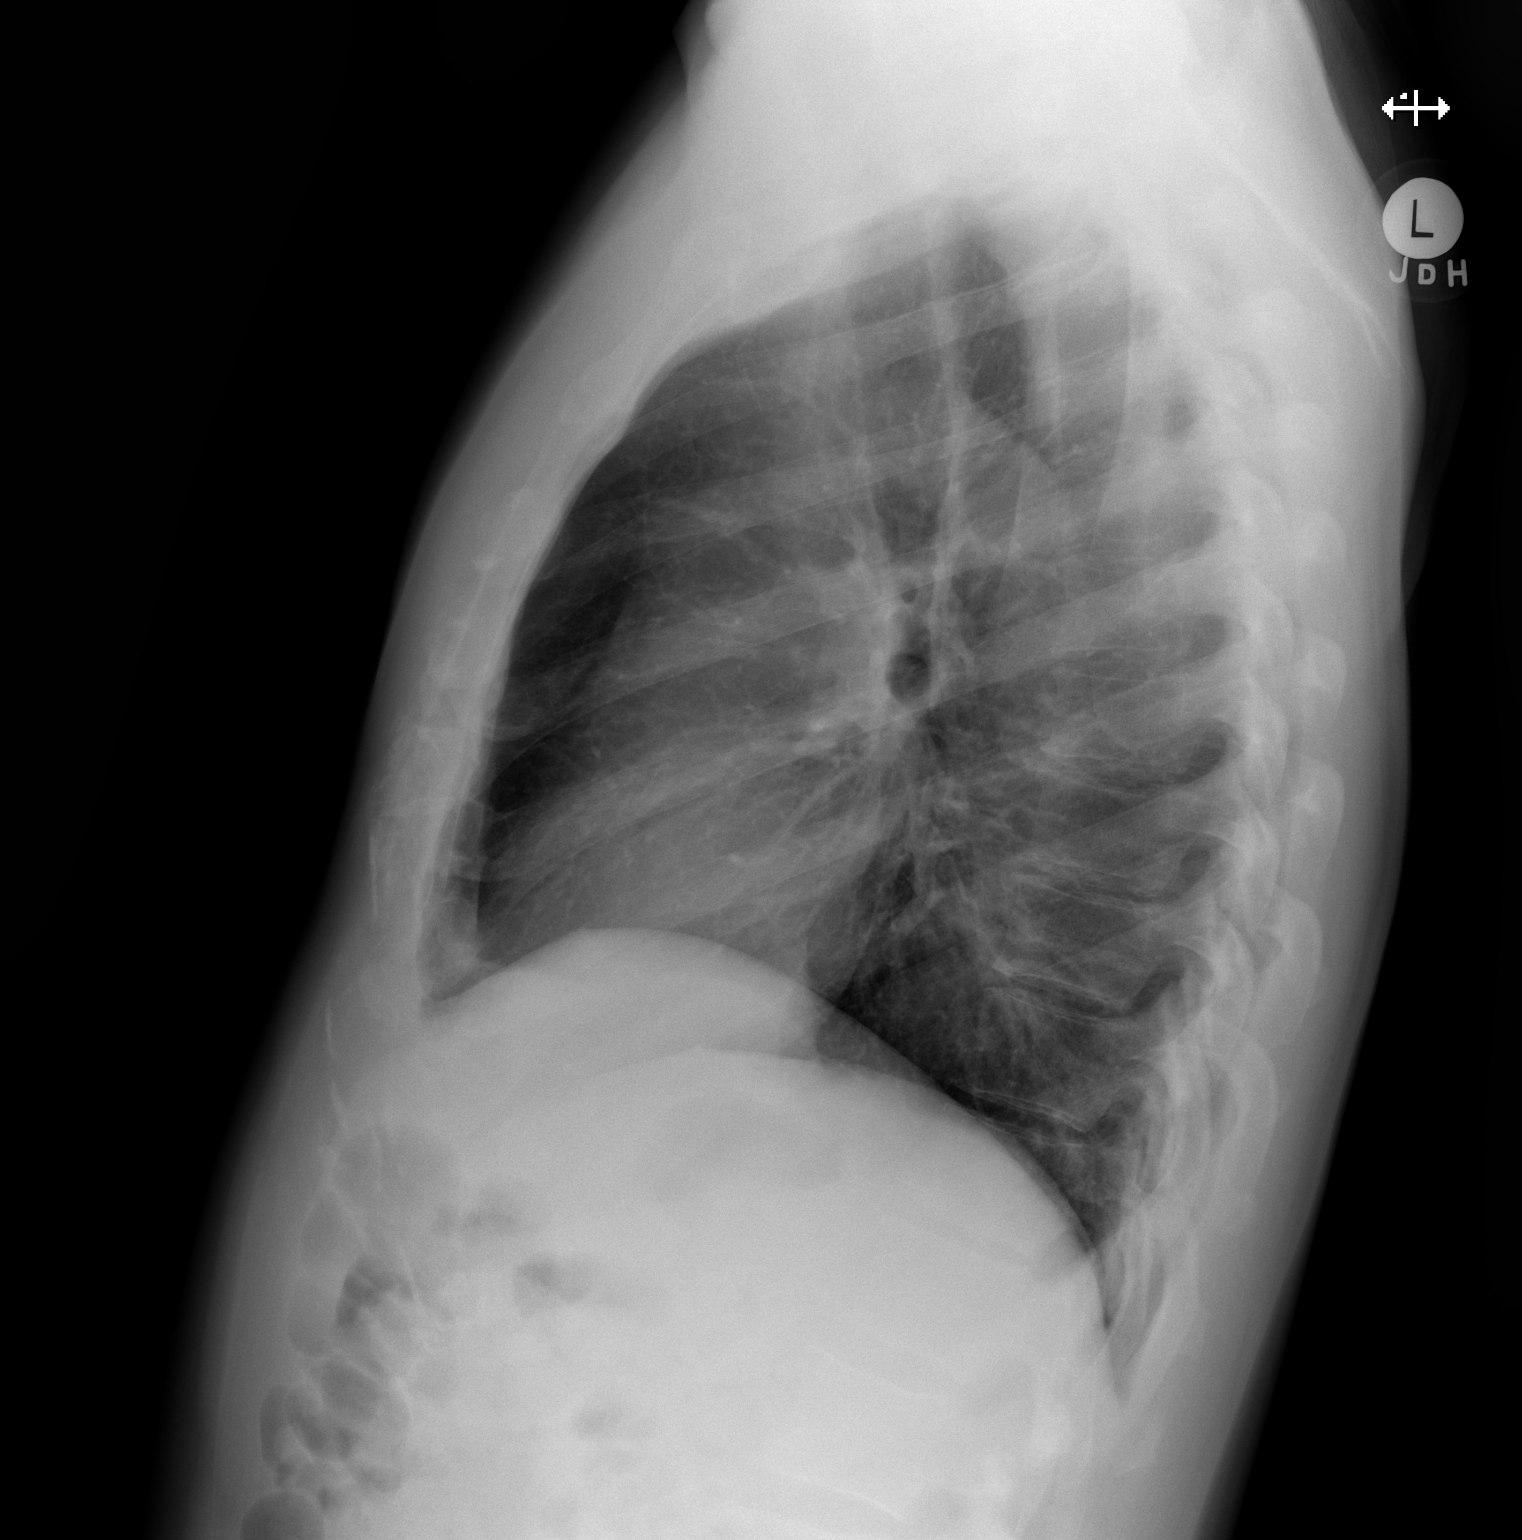

[2 of 2 positions shown; findings below may reference images not displayed]

FINDINGS: The heart size and mediastinal contours are within normal limits.
Both lungs are clear. The visualized skeletal structures are
unremarkable.
IMPRESSION: No active cardiopulmonary disease.

## 2019-03-21 ENCOUNTER — Other Ambulatory Visit: Payer: Self-pay | Admitting: Family Medicine

## 2019-04-23 ENCOUNTER — Other Ambulatory Visit: Payer: Self-pay | Admitting: Family Medicine

## 2019-04-23 MED ORDER — DUEXIS 800-26.6 MG PO TABS: ORAL_TABLET | ORAL | 0 refills | Status: DC

## 2019-04-23 MED ORDER — SILDENAFIL CITRATE 20 MG PO TABS: 40.0000 mg | ORAL_TABLET | Freq: Every day | ORAL | 3 refills | Status: DC | PRN

## 2019-04-23 NOTE — Telephone Encounter (Signed)
Patient called to schedule CPE, but stated he needed refills on medications    He would like his DUEXIS sent to [facility]  And his Sildenafil- [facility]

## 2019-04-23 NOTE — Telephone Encounter (Signed)
appointment made for 05/28/2019, ok to refill to get patient thru until then?

## 2019-04-28 ENCOUNTER — Other Ambulatory Visit: Payer: Self-pay | Admitting: Family Medicine

## 2019-04-29 NOTE — Telephone Encounter (Signed)
Duexis Last filled:  04/28/19, #90 Last OV:  04/18/17, CPE Next OV:  05/28/19, CPE

## 2019-05-18 ENCOUNTER — Other Ambulatory Visit: Payer: Self-pay | Admitting: Family Medicine

## 2019-05-18 DIAGNOSIS — Z125 Encounter for screening for malignant neoplasm of prostate: Secondary | ICD-10-CM

## 2019-05-18 DIAGNOSIS — E785 Hyperlipidemia, unspecified: Secondary | ICD-10-CM

## 2019-05-21 ENCOUNTER — Other Ambulatory Visit (INDEPENDENT_AMBULATORY_CARE_PROVIDER_SITE_OTHER): Payer: 59

## 2019-05-21 ENCOUNTER — Other Ambulatory Visit: Payer: Self-pay

## 2019-05-21 DIAGNOSIS — E785 Hyperlipidemia, unspecified: Secondary | ICD-10-CM

## 2019-05-21 DIAGNOSIS — Z125 Encounter for screening for malignant neoplasm of prostate: Secondary | ICD-10-CM | POA: Diagnosis not present

## 2019-05-21 LAB — COMPREHENSIVE METABOLIC PANEL
ALT: 17 U/L (ref 0–53)
AST: 15 U/L (ref 0–37)
Albumin: 4.1 g/dL (ref 3.5–5.2)
Alkaline Phosphatase: 58 U/L (ref 39–117)
BUN: 14 mg/dL (ref 6–23)
CO2: 29 mEq/L (ref 19–32)
Calcium: 9.2 mg/dL (ref 8.4–10.5)
Chloride: 105 mEq/L (ref 96–112)
Creatinine, Ser: 0.98 mg/dL (ref 0.40–1.50)
GFR: 78.4 mL/min (ref >60.00)
Glucose, Bld: 112 mg/dL — ABNORMAL HIGH (ref 70–99)
Potassium: 4 mEq/L (ref 3.5–5.1)
Sodium: 140 mEq/L (ref 135–145)
Total Bilirubin: 0.7 mg/dL (ref 0.2–1.2)
Total Protein: 6.4 g/dL (ref 6.0–8.3)

## 2019-05-21 LAB — LIPID PANEL
Cholesterol: 192 mg/dL (ref 0–200)
HDL: 31.6 mg/dL — ABNORMAL LOW (ref >39.00)
LDL Cholesterol: 120 mg/dL — ABNORMAL HIGH (ref 0–99)
NonHDL: 159.99
Total CHOL/HDL Ratio: 6
Triglycerides: 198 mg/dL — ABNORMAL HIGH (ref 0.0–149.0)
VLDL: 39.6 mg/dL (ref 0.0–40.0)

## 2019-05-21 LAB — PSA: PSA: 0.71 ng/mL (ref 0.10–4.00)

## 2019-05-28 ENCOUNTER — Encounter: Payer: Self-pay | Admitting: Family Medicine

## 2019-05-28 ENCOUNTER — Ambulatory Visit (INDEPENDENT_AMBULATORY_CARE_PROVIDER_SITE_OTHER): Payer: 59 | Admitting: Family Medicine

## 2019-05-28 ENCOUNTER — Other Ambulatory Visit: Payer: Self-pay

## 2019-05-28 VITALS — BP 134/80 | HR 71 | Temp 97.8°F | Ht 71.5 in | Wt 198.0 lb

## 2019-05-28 DIAGNOSIS — M79644 Pain in right finger(s): Secondary | ICD-10-CM | POA: Insufficient documentation

## 2019-05-28 DIAGNOSIS — J302 Other seasonal allergic rhinitis: Secondary | ICD-10-CM

## 2019-05-28 DIAGNOSIS — M5441 Lumbago with sciatica, right side: Secondary | ICD-10-CM

## 2019-05-28 DIAGNOSIS — E785 Hyperlipidemia, unspecified: Secondary | ICD-10-CM

## 2019-05-28 DIAGNOSIS — M545 Low back pain, unspecified: Secondary | ICD-10-CM | POA: Insufficient documentation

## 2019-05-28 DIAGNOSIS — Z1211 Encounter for screening for malignant neoplasm of colon: Secondary | ICD-10-CM

## 2019-05-28 DIAGNOSIS — Z0001 Encounter for general adult medical examination with abnormal findings: Secondary | ICD-10-CM | POA: Diagnosis not present

## 2019-05-28 DIAGNOSIS — M25521 Pain in right elbow: Secondary | ICD-10-CM

## 2019-05-28 DIAGNOSIS — F17200 Nicotine dependence, unspecified, uncomplicated: Secondary | ICD-10-CM

## 2019-05-28 MED ORDER — CENTRUM SILVER 50+MEN PO TABS: 1.0000 | ORAL_TABLET | Freq: Every day | ORAL | Status: AC

## 2019-05-28 MED ORDER — VITAMIN D3 25 MCG (1000 UT) PO CAPS: 1.0000 | ORAL_CAPSULE | Freq: Every day | ORAL | Status: DC

## 2019-05-28 MED ORDER — ASCORBIC ACID 500 MG PO TABS: 500.0000 mg | ORAL_TABLET | Freq: Every day | ORAL | Status: DC

## 2019-05-28 MED ORDER — FLUTICASONE PROPIONATE 50 MCG/ACT NA SUSP: 2.0000 | Freq: Every day | NASAL | 6 refills | Status: DC

## 2019-05-28 MED ORDER — MONTELUKAST SODIUM 10 MG PO TABS: 10.0000 mg | ORAL_TABLET | Freq: Every day | ORAL | 11 refills | Status: DC

## 2019-05-28 NOTE — Assessment & Plan Note (Signed)
Preventative protocols reviewed and updated unless pt declined. Discussed healthy diet and lifestyle.  

## 2019-05-28 NOTE — Assessment & Plan Note (Signed)
Continue to encourage cessation. Contemplative. He did quit for several months in 2020 with use of wellbutrin but did restart in setting of boredom from pandemic

## 2019-05-28 NOTE — Assessment & Plan Note (Signed)
Suspect ulnar neuropathy - will refer to hand surgery for further eval as affecting grip strength.

## 2019-05-28 NOTE — Assessment & Plan Note (Signed)
Chronic, stable off medication. Encouraged increased aerobic exercise to improve HDL. The ASCVD Risk score Denman George DC Jr., et al., 2013) failed to calculate for the following reasons:   Unable to determine if patient is Non-Hispanic African American

## 2019-05-28 NOTE — Assessment & Plan Note (Addendum)
Worsening bothersome PNdrainage - has benefited from wife's singulair - will fill this along with recommended OTC flonase at night time given symptoms worse when he awakens.

## 2019-05-28 NOTE — Assessment & Plan Note (Signed)
Suspect trigger thumb - will refer to hand surgery.

## 2019-05-28 NOTE — Patient Instructions (Addendum)
Start singulair (montelukast) and flonase nightly for allergies.  We will refer you to hand doctor for thumb trigger finger and for possible ulnar nerve neuropathy at elbow.  For possible sciatica - do exercises provided today - continue duexis.  There is a new 2 shot shingles series (shingrix) - if interested let us known for nurse visit or check with pharmacy.  You are doing well today.  Return as needed or in 1 year for next physical.  Health Maintenance, Male Adopting a healthy lifestyle and getting preventive care are important in promoting health and wellness. Ask your health care provider about:  The right schedule for you to have regular tests and exams.  Things you can do on your own to prevent diseases and keep yourself healthy. What should I know about diet, weight, and exercise? Eat a healthy diet   Eat a diet that includes plenty of vegetables, fruits, low-fat dairy products, and lean protein.  Do not eat a lot of foods that are high in solid fats, added sugars, or sodium. Maintain a healthy weight Body mass index (BMI) is a measurement that can be used to identify possible weight problems. It estimates body fat based on height and weight. Your health care provider can help determine your BMI and help you achieve or maintain a healthy weight. Get regular exercise Get regular exercise. This is one of the most important things you can do for your health. Most adults should:  Exercise for at least 150 minutes each week. The exercise should increase your heart rate and make you sweat (moderate-intensity exercise).  Do strengthening exercises at least twice a week. This is in addition to the moderate-intensity exercise.  Spend less time sitting. Even light physical activity can be beneficial. Watch cholesterol and blood lipids Have your blood tested for lipids and cholesterol at 59 years of age, then have this test every 5 years. You may need to have your cholesterol levels  checked more often if:  Your lipid or cholesterol levels are high.  You are older than 59 years of age.  You are at high risk for heart disease. What should I know about cancer screening? Many types of cancers can be detected early and may often be prevented. Depending on your health history and family history, you may need to have cancer screening at various ages. This may include screening for:  Colorectal cancer.  Prostate cancer.  Skin cancer.  Lung cancer. What should I know about heart disease, diabetes, and high blood pressure? Blood pressure and heart disease  High blood pressure causes heart disease and increases the risk of stroke. This is more likely to develop in people who have high blood pressure readings, are of African descent, or are overweight.  Talk with your health care provider about your target blood pressure readings.  Have your blood pressure checked: ? Every 3-5 years if you are 51-107 years of age. ? Every year if you are 89 years old or older.  If you are between the ages of 60 and 25 and are a current or former smoker, ask your health care provider if you should have a one-time screening for abdominal aortic aneurysm (AAA). Diabetes Have regular diabetes screenings. This checks your fasting blood sugar level. Have the screening done:  Once every three years after age 65 if you are at a normal weight and have a low risk for diabetes.  More often and at a younger age if you are overweight or have a  high risk for diabetes. What should I know about preventing infection? Hepatitis B If you have a higher risk for hepatitis B, you should be screened for this virus. Talk with your health care provider to find out if you are at risk for hepatitis B infection. Hepatitis C Blood testing is recommended for:  Everyone born from [birth year] through 1965.  Anyone with known risk factors for hepatitis C. Sexually transmitted infections (STIs)  You should be screened  each year for STIs, including gonorrhea and chlamydia, if: ? You are sexually active and are younger than 59 years of age. ? You are older than 59 years of age and your health care provider tells you that you are at risk for this type of infection. ? Your sexual activity has changed since you were last screened, and you are at increased risk for chlamydia or gonorrhea. Ask your health care provider if you are at risk.  Ask your health care provider about whether you are at high risk for HIV. Your health care provider may recommend a prescription medicine to help prevent HIV infection. If you choose to take medicine to prevent HIV, you should first get tested for HIV. You should then be tested every 3 months for as long as you are taking the medicine. Follow these instructions at home: Lifestyle  Do not use any products that contain nicotine or tobacco, such as cigarettes, e-cigarettes, and chewing tobacco. If you need help quitting, ask your health care provider.  Do not use street drugs.  Do not share needles.  Ask your health care provider for help if you need support or information about quitting drugs. Alcohol use  Do not drink alcohol if your health care provider tells you not to drink.  If you drink alcohol: ? Limit how much you have to 0-2 drinks a day. ? Be aware of how much alcohol is in your drink. In the [place] one drink equals one 12 oz bottle of beer (355 mL), one 5 oz glass of wine (148 mL), or one 1 oz glass of hard liquor (44 mL). General instructions  Schedule regular health, dental, and eye exams.  Stay current with your vaccines.  Tell your health care provider if: ? You often feel depressed. ? You have ever been abused or do not feel safe at home. Summary  Adopting a healthy lifestyle and getting preventive care are important in promoting health and wellness.  Follow your health care provider's instructions about healthy diet, exercising, and getting tested or  screened for diseases.  Follow your health care provider's instructions on monitoring your cholesterol and blood pressure. This information is not intended to replace advice given to you by your health care provider. Make sure you discuss any questions you have with your health care provider. Document Revised: 03/27/2018 Document Reviewed: 03/27/2018 Elsevier Patient Education  2020 [employer].

## 2019-05-28 NOTE — Assessment & Plan Note (Signed)
Exam suspicious for R sided sciatic pain - provided with piriformis exercises to try. Continue duexis.

## 2019-05-28 NOTE — Progress Notes (Signed)
This visit was conducted in person.  BP 134/80 (BP Location: Left Arm, Patient Position: Sitting, Cuff Size: Normal)   Pulse 71   Temp 97.8 F (36.6 C) (Temporal)   Ht 5' 11.5" (1.816 m)   Wt 198 lb (89.8 kg)   SpO2 96%   BMI 27.23 kg/m    CC: CPE Subjective:    Patient ID: Gregory Johnson, male    DOB: 05-27-1960, 59 y.o.   MRN: [id number]  HPI: Gregory Johnson is a 59 y.o. male presenting on 05/28/2019 for Annual Exam (Pt provided recent labs (made copy). 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  Has 2 grandchildren (2018).   Allergy flare worse at night time - notes constant PNdrainage, worse in the morning. Has been taking wife's montelukast with benefit. Has tried OTC remedies (antihistamines and OTC INS). No runny nose, itchy watery eyes, nasal congestion.   Worsening R elbow pain leading to R hand weakness and ulnar 4 fingers can get numb. Also noticing triggering of R thumb for last 2-3 months.   Noticing worsening R hip pain - managing with duexis 1 tab daily.   Preventative: Colon cancer screening - discussed options - has had yearly stool kit. Requests continue.  Prostate screening - would like yearly screen. No prostate symptoms.  Flu - declines  Tdap 2012, Td 2016  shingrix - discussed.  Covid vaccine - discussed, will receive when available.  Seat belt use discussed  Sunscreen use discussed. Nochangingmoles. Smoking - 1ppd. Had quit 2020, restarted 6 mo ago. ~20 PY hx Alcohol - 2 beers a few nights a week Dentist q6 mo Eye exam yearly  Caffeine: 2 cups coffee, 1 coke daily, lots of soda  Lives with wife, 2 daughters who recently graduated college, 3 dogs  Edu: some college  Occupation: Airline pilot  Activity: walks, treadmill, gym- walks 3 mi/day at station Diet: good water, some fruits/vegetables     Relevant past medical, surgical, family and social history reviewed and updated as indicated. Interim medical history since our last visit  reviewed. Allergies and medications reviewed and updated. Outpatient Medications Prior to Visit  Medication Sig Dispense Refill  . DUEXIS 800-26.6 MG TABS TAKE ONE TABLET BY MOUTH THREE TIMES DAILY 90 tablet 1  . sildenafil (REVATIO) 20 MG tablet Take 2-5 tablets (40-100 mg total) by mouth daily as needed (relations). 30 tablet 3  . buPROPion (WELLBUTRIN) 75 MG tablet Take 1 tablet (75 mg total) by mouth 2 (two) times daily. 60 tablet 2   Facility-Administered Medications Prior to Visit  Medication Dose Route Frequency Provider Last Rate Last Admin  . betamethasone acetate-betamethasone sodium phosphate (CELESTONE) injection 3 mg  3 mg Intramuscular Once Daylene Katayama M, DPM      . betamethasone acetate-betamethasone sodium phosphate (CELESTONE) injection 3 mg  3 mg Intramuscular Once Edrick Kins, DPM         Per HPI unless specifically indicated in ROS section below Review of Systems  Constitutional: Negative for activity change, appetite change, chills, fatigue, fever and unexpected weight change.  HENT: Positive for postnasal drip. Negative for hearing loss.   Eyes: Negative for visual disturbance.  Respiratory: Negative for cough, chest tightness, shortness of breath and wheezing.   Cardiovascular: Negative for chest pain, palpitations and leg swelling.  Gastrointestinal: Negative for abdominal distention, abdominal pain, blood in stool, constipation, diarrhea, nausea and vomiting.  Genitourinary: Negative for difficulty urinating and hematuria.  Musculoskeletal: Negative for arthralgias, myalgias and neck pain.  Skin: Negative for rash.  Neurological: Negative for dizziness, seizures, syncope and headaches.  Hematological: Negative for adenopathy. Does not bruise/bleed easily.  Psychiatric/Behavioral: Negative for dysphoric mood. The patient is not nervous/anxious.    Objective:    BP 134/80 (BP Location: Left Arm, Patient Position: Sitting, Cuff Size: Normal)   Pulse 71    Temp 97.8 F (36.6 C) (Temporal)   Ht 5' 11.5" (1.816 m)   Wt 198 lb (89.8 kg)   SpO2 96%   BMI 27.23 kg/m   Wt Readings from Last 3 Encounters:  05/28/19 198 lb (89.8 kg)  04/18/17 191 lb 8 oz (86.9 kg)  05/17/16 194 lb 8 oz (88.2 kg)    Physical Exam Vitals and nursing note reviewed.  Constitutional:      General: He is not in acute distress.    Appearance: Normal appearance. He is well-developed. He is not ill-appearing.  HENT:     Head: Normocephalic and atraumatic.     Right Ear: Hearing, tympanic membrane, ear canal and external ear normal.     Left Ear: Hearing, tympanic membrane, ear canal and external ear normal.     Mouth/Throat:     Pharynx: Uvula midline.  Eyes:     General: No scleral icterus.    Extraocular Movements: Extraocular movements intact.     Conjunctiva/sclera: Conjunctivae normal.     Pupils: Pupils are equal, round, and reactive to light.  Cardiovascular:     Rate and Rhythm: Normal rate and regular rhythm.     Pulses: Normal pulses.          Radial pulses are 2+ on the right side and 2+ on the left side.     Heart sounds: Normal heart sounds. No murmur.  Pulmonary:     Effort: Pulmonary effort is normal. No respiratory distress.     Breath sounds: Normal breath sounds. No wheezing, rhonchi or rales.  Abdominal:     General: Abdomen is flat. Bowel sounds are normal. There is no distension.     Palpations: Abdomen is soft. There is no mass.     Tenderness: There is no abdominal tenderness. There is no guarding or rebound.     Hernia: No hernia is present.  Musculoskeletal:        General: Tenderness present. Normal range of motion.     Cervical back: Normal range of motion and neck supple.     Right lower leg: No edema.     Left lower leg: No edema.     Comments:  2+ rad pulses Tender thumb to palpation at R 1st MCPJ, triggering present Tender to palpation at lateral R elbow distal to lateral epicondyle Neg SLR bilaterally. No pain with  int/ext rotation at hip. Discomfort to palpation at R sciatic notch. No pain at SIJ, GTB.   Lymphadenopathy:     Cervical: No cervical adenopathy.  Skin:    General: Skin is warm and dry.     Findings: No rash.  Neurological:     General: No focal deficit present.     Mental Status: He is alert and oriented to person, place, and time.     Comments: CN grossly intact, station and gait intact  Psychiatric:        Mood and Affect: Mood normal.        Behavior: Behavior normal.        Thought Content: Thought content normal.        Judgment: Judgment normal.  Results for orders placed or performed in visit on 05/21/19  PSA  Result Value Ref Range   PSA 0.71 0.10 - 4.00 ng/mL  Comprehensive metabolic panel  Result Value Ref Range   Sodium 140 135 - 145 mEq/L   Potassium 4.0 3.5 - 5.1 mEq/L   Chloride 105 96 - 112 mEq/L   CO2 29 19 - 32 mEq/L   Glucose, Bld 112 (H) 70 - 99 mg/dL   BUN 14 6 - 23 mg/dL   Creatinine, Ser 0.98 0.40 - 1.50 mg/dL   Total Bilirubin 0.7 0.2 - 1.2 mg/dL   Alkaline Phosphatase 58 39 - 117 U/L   AST 15 0 - 37 U/L   ALT 17 0 - 53 U/L   Total Protein 6.4 6.0 - 8.3 g/dL   Albumin 4.1 3.5 - 5.2 g/dL   GFR 78.40 >60.00 mL/min   Calcium 9.2 8.4 - 10.5 mg/dL  Lipid panel  Result Value Ref Range   Cholesterol 192 0 - 200 mg/dL   Triglycerides 198.0 (H) 0.0 - 149.0 mg/dL   HDL 31.60 (L) >39.00 mg/dL   VLDL 39.6 0.0 - 40.0 mg/dL   LDL Cholesterol 120 (H) 0 - 99 mg/dL   Total CHOL/HDL Ratio 6    NonHDL 159.99    Assessment & Plan:  This visit occurred during the SARS-CoV-2 public health emergency.  Safety protocols were in place, including screening questions prior to the visit, additional usage of staff PPE, and extensive cleaning of exam room while observing appropriate contact time as indicated for disinfecting solutions.   Problem List Items Addressed This Visit    Smoker    Continue to encourage cessation. Contemplative. He did quit for several  months in 2020 with use of wellbutrin but did restart in setting of boredom from pandemic      Seasonal allergies    Worsening bothersome PNdrainage - has benefited from wife's singulair - will fill this along with recommended OTC flonase at night time given symptoms worse when he awakens.       Right low back pain    Exam suspicious for R sided sciatic pain - provided with piriformis exercises to try. Continue duexis.       Right elbow pain    Suspect ulnar neuropathy - will refer to hand surgery for further eval as affecting grip strength.       Relevant Orders   Ambulatory referral to Hand Surgery   Pain of right thumb    Suspect trigger thumb - will refer to hand surgery.       Relevant Orders   Ambulatory referral to Hand Surgery   Encounter for general adult medical examination with abnormal findings - Primary    Preventative protocols reviewed and updated unless pt declined. Discussed healthy diet and lifestyle.       Dyslipidemia    Chronic, stable off medication. Encouraged increased aerobic exercise to improve HDL. The ASCVD Risk score Mikey Bussing DC Jr., et al., 2013) failed to calculate for the following reasons:   Unable to determine if patient is Non-Hispanic African American        Other Visit Diagnoses    Special screening for malignant neoplasms, colon       Relevant Orders   Fecal occult blood, imunochemical       Meds ordered this encounter  Medications  . montelukast (SINGULAIR) 10 MG tablet    Sig: Take 1 tablet (10 mg total) by mouth at bedtime.  Dispense:  30 tablet    Refill:  11  . fluticasone (FLONASE) 50 MCG/ACT nasal spray    Sig: Place 2 sprays into both nostrils at bedtime.    Dispense:  16 g    Refill:  6  . Multiple Vitamins-Minerals (CENTRUM SILVER 50+MEN) TABS    Sig: Take 1 tablet by mouth daily.  . Cholecalciferol (VITAMIN D3) 25 MCG (1000 UT) CAPS    Sig: Take 1 capsule (1,000 Units total) by mouth daily.    Dispense:  30  capsule  . vitamin C (VITAMIN C) 500 MG tablet    Sig: Take 1 tablet (500 mg total) by mouth daily.   Orders Placed This Encounter  Procedures  . Fecal occult blood, imunochemical    Standing Status:   Future    Standing Expiration Date:   05/27/2020  . Ambulatory referral to Hand Surgery    Referral Priority:   Routine    Referral Type:   Surgical    Referral Reason:   Specialty Services Required    Requested Specialty:   Hand Surgery    Number of Visits Requested:   1    Patient instructions: Start singulair (montelukast) and flonase nightly for allergies.  We will refer you to hand doctor for thumb trigger finger and for possible ulnar nerve neuropathy at elbow.  For possible sciatica - do exercises provided today - continue duexis.  There is a new 2 shot shingles series (shingrix) - if interested let us known for nurse visit or check with pharmacy.  You are doing well today.  Return as needed or in 1 year for next physical.  Follow up plan: Return in about 1 year (around 05/27/2020) for annual exam, prior fasting for blood work.  Ria Bush, MD

## 2019-06-19 ENCOUNTER — Other Ambulatory Visit: Payer: Self-pay | Admitting: Family Medicine

## 2019-06-19 NOTE — Telephone Encounter (Signed)
Last time refilled on 04/29/2019 1 TID #90 with 1 refill. LOV 05/28/19 CPE  No future appointments.

## 2019-08-06 ENCOUNTER — Other Ambulatory Visit: Payer: Self-pay | Admitting: Family Medicine

## 2019-12-01 ENCOUNTER — Other Ambulatory Visit: Payer: Self-pay | Admitting: Family Medicine

## 2020-05-14 ENCOUNTER — Other Ambulatory Visit: Payer: Self-pay | Admitting: Family Medicine

## 2020-05-14 DIAGNOSIS — Z1211 Encounter for screening for malignant neoplasm of colon: Secondary | ICD-10-CM

## 2020-05-14 NOTE — Telephone Encounter (Signed)
Pt called back and stated that he doesn't have anyone in particular to go too. And he needed to get his duexis refilled.

## 2020-05-14 NOTE — Addendum Note (Signed)
Addended by: Nanci Pina on: 05/14/2020 03:07 PM   Modules accepted: Orders

## 2020-05-14 NOTE — Telephone Encounter (Signed)
Please call patient - Gregory Johnson normal 2019. Did not return Gregory year's Johnson.  Does he want colonoscopy referral? If so where would he prefer to go?

## 2020-05-14 NOTE — Telephone Encounter (Signed)
Duexis Last rx:  06/20/19, #90 Last OV:  05/28/19, CPE Next OV:  07/21/20, CPE

## 2020-05-14 NOTE — Telephone Encounter (Signed)
Pt called wanted to know about getting a colonoscopy

## 2020-05-17 ENCOUNTER — Other Ambulatory Visit: Payer: Self-pay | Admitting: Family Medicine

## 2020-05-17 MED ORDER — IBUPROFEN-FAMOTIDINE 800-26.6 MG PO TABS: 1.0000 | ORAL_TABLET | Freq: Three times a day (TID) | ORAL | 1 refills | Status: DC

## 2020-05-17 NOTE — Addendum Note (Signed)
Addended by: Eustaquio Boyden on: 05/17/2020 05:36 PM   Modules accepted: Orders

## 2020-05-20 NOTE — Telephone Encounter (Signed)
Received notice from pharmacy that duexis is not covered - requesting to split into individual components - plz notify pt and see if he's willing to split meds into individual components.

## 2020-05-21 MED ORDER — FAMOTIDINE 20 MG PO TABS: 20.0000 mg | ORAL_TABLET | Freq: Two times a day (BID) | ORAL | 1 refills | Status: AC | PRN

## 2020-05-21 NOTE — Telephone Encounter (Signed)
Sent in

## 2020-05-21 NOTE — Telephone Encounter (Signed)
Spoke with pt relaying Dr. Timoteo Expose message.  Pt verbalizes understanding, agrees to split the meds and expresses his thanks for letting him know.

## 2020-06-02 ENCOUNTER — Telehealth: Payer: No Typology Code available for payment source

## 2020-06-03 ENCOUNTER — Telehealth: Payer: Self-pay

## 2020-06-03 ENCOUNTER — Telehealth: Payer: Self-pay | Admitting: Gastroenterology

## 2020-06-03 NOTE — Telephone Encounter (Signed)
Patient left a voicemail on 06/02/2020 at 3:05 that he had trouble hearing you when you called to scheduled his colonoscopy. He said he answered but you could not hear him. Can you call patient

## 2020-06-03 NOTE — Telephone Encounter (Signed)
Called patient back to reschedule triage appointment. LVM to call us back to reschedule.

## 2020-06-03 NOTE — Telephone Encounter (Signed)
Patient lvm that he was called for his colonoscopy triage, however he answered the call and was not heard on our end.  I've asked front office to call him back to reschedule his triage for colonoscopy.  Thanks,  [place], [place]

## 2020-06-10 ENCOUNTER — Telehealth (INDEPENDENT_AMBULATORY_CARE_PROVIDER_SITE_OTHER): Payer: No Typology Code available for payment source | Admitting: Gastroenterology

## 2020-06-10 ENCOUNTER — Other Ambulatory Visit: Payer: Self-pay

## 2020-06-10 DIAGNOSIS — Z1211 Encounter for screening for malignant neoplasm of colon: Secondary | ICD-10-CM

## 2020-06-10 MED ORDER — NA SULFATE-K SULFATE-MG SULF 17.5-3.13-1.6 GM/177ML PO SOLN: 354.0000 mL | Freq: Once | ORAL | 0 refills | Status: AC

## 2020-06-10 NOTE — Progress Notes (Signed)
Gastroenterology Pre-Procedure Review  Request Date: 07/05/2020 Requesting Physician: Dr. Allegra Lai   PATIENT REVIEW QUESTIONS: The patient responded to the following health history questions as indicated:    1. Are you having any GI issues? no 2. Do you have a personal history of Polyps? no 3. Do you have a family history of Colon Cancer or Polyps? Dad had colon polyps  4. Diabetes Mellitus? no 5. Joint replacements in the past 12 months?no 6. Major health problems in the past 3 months?no 7. Any artificial heart valves, MVP, or defibrillator?no    MEDICATIONS & ALLERGIES:    Patient reports the following regarding taking any anticoagulation/antiplatelet therapy:   Plavix, Coumadin, Eliquis, Xarelto, Lovenox, Pradaxa, Brilinta, or Effient? no Aspirin? Aspirin 81mg    Patient confirms/reports the following medications:  Current Outpatient Medications  Medication Sig Dispense Refill  . Cholecalciferol (VITAMIN D3) 25 MCG (1000 UT) CAPS Take 1 capsule (1,000 Units total) by mouth daily. 30 capsule   . famotidine (PEPCID) 20 MG tablet Take 1 tablet (20 mg total) by mouth 2 (two) times daily as needed for heartburn or indigestion (take with ibuprofen). 90 tablet 1  . fluticasone (FLONASE) 50 MCG/ACT nasal spray Place 2 sprays into both nostrils at bedtime. 16 g 6  . ibuprofen (ADVIL) 800 MG tablet Take 1 tablet (800 mg total) by mouth every 8 (eight) hours as needed. 90 tablet 1  . montelukast (SINGULAIR) 10 MG tablet Take 1 tablet (10 mg total) by mouth at bedtime. 30 tablet 11  . Multiple Vitamins-Minerals (CENTRUM SILVER 50+MEN) TABS Take 1 tablet by mouth daily.    . sildenafil (REVATIO) 20 MG tablet TAKE 2-5 TABLETS (40-100 MG TOTAL) BY MOUTH DAILY AS NEEDED (RELATIONS). 30 tablet 3  . vitamin C (VITAMIN C) 500 MG tablet Take 1 tablet (500 mg total) by mouth daily.     Current Facility-Administered Medications  Medication Dose Route Frequency Provider Last Rate Last Admin  .  betamethasone acetate-betamethasone sodium phosphate (CELESTONE) injection 3 mg  3 mg Intramuscular Once M, DPM      . betamethasone acetate-betamethasone sodium phosphate (CELESTONE) injection 3 mg  3 mg Intramuscular Once M, DPM        Patient confirms/reports the following allergies:  No Known Allergies  No orders of the defined types were placed in this encounter.   AUTHORIZATION INFORMATION Primary Insurance: 1D#: Group #:  Secondary Insurance: 1D#: Group #:  SCHEDULE INFORMATION: Date:  Time: Location:

## 2020-07-01 ENCOUNTER — Other Ambulatory Visit: Payer: Self-pay

## 2020-07-01 ENCOUNTER — Other Ambulatory Visit
Admission: RE | Admit: 2020-07-01 | Discharge: 2020-07-01 | Disposition: A | Discharge status: Home / Self Care | Payer: 59 | Source: Ambulatory Visit | Attending: Gastroenterology | Admitting: Gastroenterology

## 2020-07-01 DIAGNOSIS — Z01812 Encounter for preprocedural laboratory examination: Secondary | ICD-10-CM | POA: Diagnosis not present

## 2020-07-01 DIAGNOSIS — Z20822 Contact with and (suspected) exposure to covid-19: Secondary | ICD-10-CM | POA: Insufficient documentation

## 2020-07-01 LAB — SARS CORONAVIRUS 2 (TAT 6-24 HRS): SARS Coronavirus 2: NEGATIVE

## 2020-07-02 ENCOUNTER — Encounter: Payer: Self-pay | Admitting: Gastroenterology

## 2020-07-05 ENCOUNTER — Ambulatory Visit: Payer: 59 | Admitting: Certified Registered"

## 2020-07-05 ENCOUNTER — Encounter
Admission: RE | Disposition: A | Discharge status: Home / Self Care | Payer: Self-pay | Source: Home / Self Care | Attending: Gastroenterology

## 2020-07-05 ENCOUNTER — Other Ambulatory Visit: Payer: Self-pay

## 2020-07-05 ENCOUNTER — Ambulatory Visit
Admission: RE | Admit: 2020-07-05 | Discharge: 2020-07-05 | Disposition: A | Discharge status: Home / Self Care | Payer: 59 | Attending: Gastroenterology | Admitting: Gastroenterology

## 2020-07-05 ENCOUNTER — Encounter: Payer: Self-pay | Admitting: Gastroenterology

## 2020-07-05 DIAGNOSIS — Z1211 Encounter for screening for malignant neoplasm of colon: Secondary | ICD-10-CM | POA: Insufficient documentation

## 2020-07-05 DIAGNOSIS — K5730 Diverticulosis of large intestine without perforation or abscess without bleeding: Secondary | ICD-10-CM | POA: Diagnosis not present

## 2020-07-05 DIAGNOSIS — K644 Residual hemorrhoidal skin tags: Secondary | ICD-10-CM | POA: Diagnosis not present

## 2020-07-05 DIAGNOSIS — F17210 Nicotine dependence, cigarettes, uncomplicated: Secondary | ICD-10-CM | POA: Insufficient documentation

## 2020-07-05 HISTORY — PX: COLONOSCOPY WITH PROPOFOL: SHX5780

## 2020-07-05 SURGERY — COLONOSCOPY WITH PROPOFOL
Anesthesia: General

## 2020-07-05 MED ORDER — PROPOFOL 10 MG/ML IV BOLUS
INTRAVENOUS | Status: DC | PRN
  Administered 2020-07-05: 80 mg via INTRAVENOUS
  Administered 2020-07-05: 20 mg via INTRAVENOUS

## 2020-07-05 MED ORDER — PROPOFOL 500 MG/50ML IV EMUL
INTRAVENOUS | Status: AC
  Filled 2020-07-05: qty 50

## 2020-07-05 MED ORDER — LIDOCAINE HCL (CARDIAC) PF 100 MG/5ML IV SOSY
PREFILLED_SYRINGE | INTRAVENOUS | Status: DC | PRN
  Administered 2020-07-05: 50 mg via INTRAVENOUS

## 2020-07-05 MED ORDER — GLYCOPYRROLATE 0.2 MG/ML IJ SOLN
INTRAMUSCULAR | Status: DC | PRN
  Administered 2020-07-05: .2 mg via INTRAVENOUS

## 2020-07-05 MED ORDER — PROPOFOL 10 MG/ML IV BOLUS
INTRAVENOUS | Status: AC
  Filled 2020-07-05: qty 20

## 2020-07-05 MED ORDER — PROPOFOL 500 MG/50ML IV EMUL
INTRAVENOUS | Status: DC | PRN
  Administered 2020-07-05: 150 ug/kg/min via INTRAVENOUS

## 2020-07-05 MED ORDER — SODIUM CHLORIDE 0.9 % IV SOLN: INTRAVENOUS | Status: DC

## 2020-07-05 NOTE — H&P (Signed)
Arlyss Repress, MD [address]  [address]  [place], [place] [address]  Main: [phone]  Fax: [phone] Pager: [phone]  Primary Care Physician:  Eustaquio Boyden, MD Primary Gastroenterologist:  Dr. Arlyss Repress  Pre-Procedure History & Physical: HPI:  Gregory Johnson is a 60 y.o. male is here for an colonoscopy.   Past Medical History:  Diagnosis Date  . ED (erectile dysfunction)   . Orthostatic headache 10/24/2011   found to have T3/4 prominent lateral osteophytes on CT myelo with epidural contrast at levels below this, resolved after fluoro guided blood patch (Dr. Milinda Pointer)  . Seasonal allergies   . Smoker    minimal    Past Surgical History:  Procedure Laterality Date  . APPENDECTOMY  1978  . Bicep and Labrum repair  8/16  . EPIDURAL BLOOD [facility]  10/2011   spontaneous low CSF pressure HAs  . HERNIA REPAIR  1979   left side    Prior to Admission medications   Medication Sig Start Date End Date Taking? Authorizing Provider  Cholecalciferol (VITAMIN D3) 25 MCG (1000 UT) CAPS Take 1 capsule (1,000 Units total) by mouth daily. 05/28/19   Eustaquio Boyden, MD  famotidine (PEPCID) 20 MG tablet Take 1 tablet (20 mg total) by mouth 2 (two) times daily as needed for heartburn or indigestion (take with ibuprofen). 05/21/20   Eustaquio Boyden, MD  fluticasone [facility]) 50 MCG/ACT nasal spray Place 2 sprays into both nostrils at bedtime. 05/28/19   Eustaquio Boyden, MD  ibuprofen (ADVIL) 800 MG tablet Take 1 tablet (800 mg total) by mouth every 8 (eight) hours as needed. 05/21/20   Eustaquio Boyden, MD  montelukast (SINGULAIR) 10 MG tablet Take 1 tablet (10 mg total) by mouth at bedtime. 05/28/19   Eustaquio Boyden, MD  Multiple Vitamins-Minerals (CENTRUM SILVER 50+MEN) TABS Take 1 tablet by mouth daily. 05/28/19   Eustaquio Boyden, MD  sildenafil (REVATIO) 20 MG tablet TAKE 2-5 TABLETS (40-100 MG TOTAL) BY MOUTH DAILY AS NEEDED (RELATIONS). 12/02/19   Eustaquio Boyden, MD  vitamin C (VITAMIN C) 500 MG tablet Take 1 tablet (500 mg total) by mouth daily. 05/28/19   Eustaquio Boyden, MD    Allergies as of 06/10/2020  . (No Known Allergies)    Family History  Problem Relation Age of Onset  . Diabetes Mother   . Hyperlipidemia Mother   . Dementia Father 65  . Cancer Maternal Grandmother        breast cancer  . Cancer Paternal Grandmother        bone cancer  . Coronary artery disease Neg Hx   . Stroke Neg Hx     Social History   Socioeconomic History  . Marital status: Married    Spouse name: Not on file  . Number of children: 2  . Years of education: some coll  . Highest education level: Not on file  Occupational History  . Occupation: Careers information officer: UNEMPLOYED  Tobacco Use  . Smoking status: Current Some Day Smoker    Types: Cigarettes  . Smokeless tobacco: Never Used  Vaping Use  . Vaping Use: Never used  Substance and Sexual Activity  . Alcohol use: Yes    Comment: none last 24 hrs  . Drug use: No  . Sexual activity: Not on file  Other Topics Concern  . Not on file  Social History Narrative   Caffeine: 2 cups coffee, 1 coke daily, lots of soda   Lives with  wife, 2 daughters who recently graduated college, 3 dogs   Edu: some college   Occupation: IT sales professional   Activity: walks, treadmill, started going to gym   Diet: good water, some fruits/vegetables    Social Determinants of Corporate investment banker Strain: Not on file  Food Insecurity: Not on file  Transportation Needs: Not on file  Physical Activity: Not on file  Stress: Not on file  Social Connections: Not on file  Intimate Partner Violence: Not on file    Review of Systems: See HPI, otherwise negative ROS  Physical Exam: BP 130/80   Temp (!) 97 F (36.1 C) (Temporal)   Resp 20   Ht 6' (1.829 m)   Wt 83.9 kg   SpO2 98%   BMI 25.09 kg/m  General:   Alert,  pleasant and cooperative in NAD Head:  Normocephalic and atraumatic. Neck:   Supple; no masses or thyromegaly. Lungs:  Clear throughout to auscultation.    Heart:  Regular rate and rhythm. Abdomen:  Soft, nontender and nondistended. Normal bowel sounds, without guarding, and without rebound.   Neurologic:  Alert and  oriented x4;  grossly normal neurologically.  Impression/Plan: Gregory Johnson is here for an colonoscopy to be performed for colon cancer screening  Risks, benefits, limitations, and alternatives regarding  colonoscopy have been reviewed with the patient.  Questions have been answered.  All parties agreeable.   Lannette Donath, MD  07/05/2020, 8:12 AM

## 2020-07-05 NOTE — Anesthesia Postprocedure Evaluation (Signed)
Anesthesia Post Note  Patient: Gregory Johnson [facility]  Procedure(s) Performed: COLONOSCOPY WITH PROPOFOL (N/A )  Patient location during evaluation: Endoscopy Anesthesia Type: General Level of consciousness: awake and alert Pain management: pain level controlled Vital Signs Assessment: post-procedure vital signs reviewed and stable Respiratory status: spontaneous breathing, nonlabored ventilation, respiratory function stable and patient connected to nasal cannula oxygen Cardiovascular status: blood pressure returned to baseline and stable Postop Assessment: no apparent nausea or vomiting Anesthetic complications: no   No complications documented.   Last Vitals:  Vitals:   07/05/20 0926 07/05/20 0932  BP: 125/73 132/85  Pulse: 65 65  Resp: 19 18  Temp:    SpO2: 97% 98%    Last Pain:  Vitals:   07/05/20 0932  TempSrc:   PainSc: 0-No pain                 Lenard Simmer

## 2020-07-05 NOTE — Transfer of Care (Signed)
Immediate Anesthesia Transfer of Care Note  Patient: Gregory Johnson [facility]  Procedure(s) Performed: COLONOSCOPY WITH PROPOFOL (N/A )  Patient Location: PACU and Endoscopy Unit  Anesthesia Type:General  Level of Consciousness: drowsy  Airway & Oxygen Therapy: Patient Spontanous Breathing  Post-op Assessment: Report given to RN  Post vital signs: stable  Last Vitals:  Vitals Value Taken Time  BP    Temp    Pulse    Resp    SpO2      Last Pain:  Vitals:   07/05/20 0743  TempSrc: Temporal  PainSc: 0-No pain         Complications: No complications documented.

## 2020-07-05 NOTE — Anesthesia Preprocedure Evaluation (Signed)
Anesthesia Evaluation  Patient identified by MRN, date of birth, ID band Patient awake    Reviewed: Allergy & Precautions, H&P , NPO status , Patient's Chart, lab work & pertinent test results, reviewed documented beta blocker date and time   History of Anesthesia Complications Negative for: history of anesthetic complications  Airway Mallampati: II  TM Distance: >3 FB Neck ROM: full    Dental  (+) Dental Advidsory Given, Caps, Chipped   Pulmonary neg shortness of breath, neg COPD, neg recent URI, Current Smoker,    Pulmonary exam normal breath sounds clear to auscultation       Cardiovascular Exercise Tolerance: Good negative cardio ROS Normal cardiovascular exam Rhythm:regular Rate:Normal     Neuro/Psych negative neurological ROS  negative psych ROS   GI/Hepatic negative GI ROS, Neg liver ROS,   Endo/Other  negative endocrine ROS  Renal/GU negative Renal ROS  negative genitourinary   Musculoskeletal   Abdominal   Peds  Hematology negative hematology ROS (+)   Anesthesia Other Findings Past Medical History: No date: ED (erectile dysfunction) 10/24/2011: Orthostatic headache     Comment:  found to have T3/4 prominent lateral osteophytes on CT               myelo with epidural contrast at levels below this,               resolved after fluoro guided blood patch (Dr. Terrace Arabia [facility]) No date: Seasonal allergies No date: Smoker     Comment:  minimal   Reproductive/Obstetrics negative OB ROS                             Anesthesia Physical Anesthesia Plan  ASA: II  Anesthesia Plan: General   Post-op Pain Management:    Induction: Intravenous  PONV Risk Score and Plan: 1 and TIVA and Propofol infusion  Airway Management Planned: Natural Airway and Nasal Cannula  Additional Equipment:   Intra-op Plan:   Post-operative Plan:   Informed Consent: I have reviewed the patients History  and Physical, chart, labs and discussed the procedure including the risks, benefits and alternatives for the proposed anesthesia with the patient or authorized representative who has indicated his/her understanding and acceptance.     Dental Advisory Given  Plan Discussed with: Anesthesiologist, CRNA and Surgeon  Anesthesia Plan Comments:         Anesthesia Quick Evaluation

## 2020-07-05 NOTE — Op Note (Signed)
[facility] Patient Name: Gregory Johnson Procedure Date: 07/05/2020 8:35 AM MRN: [id number] Account #: [id number] Date of Birth: Oct 08, 1960 Admit Type: Outpatient Age: 60 Room: [facility] ENDO ROOM 2 Gender: Male Note Status: Finalized Procedure:             Colonoscopy Indications:           Screening for colorectal malignant neoplasm, This is                         the patient's first colonoscopy Providers:             Toney Reil MD, MD Medicines:             General Anesthesia Complications:         No immediate complications. Estimated blood loss: None. Procedure:             Pre-Anesthesia Assessment:                        - Prior to the procedure, a History and Physical was                         performed, and patient medications and allergies were                         reviewed. The patient is competent. The risks and                         benefits of the procedure and the sedation options and                         risks were discussed with the patient. All questions                         were answered and informed consent was obtained.                         Patient identification and proposed procedure were                         verified by the physician, the nurse, the                         anesthesiologist, the anesthetist and the technician                         in the pre-procedure area in the procedure room in the                         endoscopy suite. Mental Status Examination: alert and                         oriented. Airway Examination: normal oropharyngeal                         airway and neck mobility. Respiratory Examination:                         clear to auscultation. CV Examination: normal.  Prophylactic Antibiotics: The patient does not require                         prophylactic antibiotics. Prior Anticoagulants: The                         patient has taken no previous  anticoagulant or                         antiplatelet agents. ASA Grade Assessment: II - A                         patient with mild systemic disease. After reviewing                         the risks and benefits, the patient was deemed in                         satisfactory condition to undergo the procedure. The                         anesthesia plan was to use general anesthesia.                         Immediately prior to administration of medications,                         the patient was re-assessed for adequacy to receive                         sedatives. The heart rate, respiratory rate, oxygen                         saturations, blood pressure, adequacy of pulmonary                         ventilation, and response to care were monitored                         throughout the procedure. The physical status of the                         patient was re-assessed after the procedure.                        After obtaining informed consent, the colonoscope was                         passed under direct vision. Throughout the procedure,                         the patient's blood pressure, pulse, and oxygen                         saturations were monitored continuously. The                         Colonoscope was introduced through the anus and  advanced to the the cecum, identified by appendiceal                         orifice and ileocecal valve. The colonoscopy was                         performed without difficulty. The patient tolerated                         the procedure well. The quality of the bowel                         preparation was evaluated using the BBPS [facility] Bowel                         Preparation Scale) with scores of: Right Colon = 3,                         Transverse Colon = 3 and Left Colon = 3 (entire mucosa                         seen well with no residual staining, small fragments                         of stool or  opaque liquid). The total BBPS score                         equals 9. Findings:      The perianal and digital rectal examinations were normal. Pertinent       negatives include normal sphincter tone and no palpable rectal lesions.      A few diverticula were found in the sigmoid colon.      Non-bleeding external hemorrhoids were found during retroflexion. The       hemorrhoids were medium-sized.      The exam was otherwise without abnormality. Impression:            - Diverticulosis in the sigmoid colon.                        - Non-bleeding external hemorrhoids.                        - The examination was otherwise normal.                        - No specimens collected. Recommendation:        - Discharge patient to home (with escort).                        - Resume previous diet today.                        - Continue present medications.                        - Repeat colonoscopy in 10 years for screening                         purposes. Procedure Code(s):     ---  Professional ---                        Z6109, Colorectal cancer screening; colonoscopy on                         individual not meeting criteria for high risk Diagnosis Code(s):     --- Professional ---                        Z12.11, Encounter for screening for malignant neoplasm                         of colon                        K64.4, Residual hemorrhoidal skin tags                        K57.30, Diverticulosis of large intestine without                         perforation or abscess without bleeding CPT copyright 2019 American Medical Association. All rights reserved. The codes documented in this report are preliminary and upon coder review may  be revised to meet current compliance requirements. Dr. Libby Maw Toney Reil MD, MD 07/05/2020 9:03:58 AM This report has been signed electronically. Number of Addenda: 0 Note Initiated On: 07/05/2020 8:35 AM Scope Withdrawal Time: 0 hours 11 minutes 4  seconds  Total Procedure Duration: 0 hours 15 minutes 5 seconds  Estimated Blood Loss:  Estimated blood loss: none.      [facility]

## 2020-07-06 ENCOUNTER — Encounter: Payer: Self-pay | Admitting: Gastroenterology

## 2020-07-13 ENCOUNTER — Other Ambulatory Visit (INDEPENDENT_AMBULATORY_CARE_PROVIDER_SITE_OTHER): Payer: 59

## 2020-07-13 ENCOUNTER — Other Ambulatory Visit: Payer: Self-pay | Admitting: Family Medicine

## 2020-07-13 ENCOUNTER — Other Ambulatory Visit: Payer: Self-pay

## 2020-07-13 DIAGNOSIS — Z125 Encounter for screening for malignant neoplasm of prostate: Secondary | ICD-10-CM | POA: Diagnosis not present

## 2020-07-13 DIAGNOSIS — E785 Hyperlipidemia, unspecified: Secondary | ICD-10-CM | POA: Diagnosis not present

## 2020-07-13 LAB — COMPREHENSIVE METABOLIC PANEL
ALT: 14 U/L (ref 0–53)
AST: 13 U/L (ref 0–37)
Albumin: 4 g/dL (ref 3.5–5.2)
Alkaline Phosphatase: 57 U/L (ref 39–117)
BUN: 13 mg/dL (ref 6–23)
CO2: 29 mEq/L (ref 19–32)
Calcium: 9.1 mg/dL (ref 8.4–10.5)
Chloride: 107 mEq/L (ref 96–112)
Creatinine, Ser: 1 mg/dL (ref 0.40–1.50)
GFR: 82.25 mL/min (ref >60.00)
Glucose, Bld: 100 mg/dL — ABNORMAL HIGH (ref 70–99)
Potassium: 4 mEq/L (ref 3.5–5.1)
Sodium: 142 mEq/L (ref 135–145)
Total Bilirubin: 0.5 mg/dL (ref 0.2–1.2)
Total Protein: 6.3 g/dL (ref 6.0–8.3)

## 2020-07-13 LAB — LIPID PANEL
Cholesterol: 177 mg/dL (ref 0–200)
HDL: 32.6 mg/dL — ABNORMAL LOW (ref >39.00)
LDL Cholesterol: 112 mg/dL — ABNORMAL HIGH (ref 0–99)
NonHDL: 144.05
Total CHOL/HDL Ratio: 5
Triglycerides: 161 mg/dL — ABNORMAL HIGH (ref 0.0–149.0)
VLDL: 32.2 mg/dL (ref 0.0–40.0)

## 2020-07-13 LAB — PSA: PSA: 0.87 ng/mL (ref 0.10–4.00)

## 2020-07-21 ENCOUNTER — Ambulatory Visit (INDEPENDENT_AMBULATORY_CARE_PROVIDER_SITE_OTHER): Payer: 59 | Admitting: Family Medicine

## 2020-07-21 ENCOUNTER — Other Ambulatory Visit: Payer: Self-pay

## 2020-07-21 ENCOUNTER — Encounter: Payer: Self-pay | Admitting: Family Medicine

## 2020-07-21 VITALS — BP 120/76 | HR 74 | Temp 98.0°F | Ht 71.5 in | Wt 198.0 lb

## 2020-07-21 DIAGNOSIS — N529 Male erectile dysfunction, unspecified: Secondary | ICD-10-CM | POA: Diagnosis not present

## 2020-07-21 DIAGNOSIS — M47812 Spondylosis without myelopathy or radiculopathy, cervical region: Secondary | ICD-10-CM

## 2020-07-21 DIAGNOSIS — F17200 Nicotine dependence, unspecified, uncomplicated: Secondary | ICD-10-CM | POA: Diagnosis not present

## 2020-07-21 DIAGNOSIS — E785 Hyperlipidemia, unspecified: Secondary | ICD-10-CM

## 2020-07-21 DIAGNOSIS — Z0000 Encounter for general adult medical examination without abnormal findings: Secondary | ICD-10-CM

## 2020-07-21 DIAGNOSIS — J302 Other seasonal allergic rhinitis: Secondary | ICD-10-CM

## 2020-07-21 MED ORDER — MONTELUKAST SODIUM 10 MG PO TABS: 10.0000 mg | ORAL_TABLET | Freq: Every day | ORAL | 11 refills | Status: DC

## 2020-07-21 NOTE — Progress Notes (Signed)
Patient ID: Gregory Johnson, male    DOB: June 28, 1960, 60 y.o.   MRN: [id number]  This visit was conducted in person.  BP 120/76   Pulse 74   Temp 98 F (36.7 C) (Temporal)   Ht 5' 11.5" (1.816 m)   Wt 198 lb (89.8 kg)   SpO2 96%   BMI 27.23 kg/m    CC: CPE Subjective:   HPI: Gregory Johnson is a 60 y.o. 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  Manages joint pains with ibuprofen 800mg  about 3x/wk.   Preventative: Colonoscopy 06/2020 - diverticulosis, ext hem, rpt 10 yrs ([place])  Prostate screening - yearly screen. No prostate symptoms.  Lung cancer screening - discussed, eligible.  Flu - declines  COVID vaccine 07/2020, planning booster through [employer].  [employer], Td 2016  Shingrix - discussed.  Seat belt use discussed  Sunscreen use discussed. Nochangingmoles. Smoking - 1/2ppd. Had quit 2020, restarted 2021. >20 PY hx.  Alcohol - 2 beers a few nights a week  Dentist q6 mo  Eye exam yearly   Caffeine: 2 cups coffee, 1 coke daily, lots of soda  Lives with wife, 2 daughters who recently graduated college, 3 dogs  Edu: some college  Occupation: 2022, retired  Activity: walks, treadmill, gym- walks70mi/day at station Diet: good water, some fruits/vegetables     Relevant past medical, surgical, family and social history reviewed and updated as indicated. Interim medical history since our last visit reviewed. Allergies and medications reviewed and updated. Outpatient Medications Prior to Visit  Medication Sig Dispense Refill  . famotidine (PEPCID) 20 MG tablet Take 1 tablet (20 mg total) by mouth 2 (two) times daily as needed for heartburn or indigestion (take with ibuprofen). 90 tablet 1  . fluticasone (FLONASE) 50 MCG/ACT nasal spray Place 2 sprays into both nostrils at bedtime. 16 g 6  . ibuprofen (ADVIL) 800 MG tablet Take 1 tablet (800 mg total) by mouth every 8 (eight) hours as needed. 90  tablet 1  . Multiple Vitamins-Minerals (CENTRUM SILVER 50+MEN) TABS Take 1 tablet by mouth daily.    . sildenafil (REVATIO) 20 MG tablet TAKE 2-5 TABLETS (40-100 MG TOTAL) BY MOUTH DAILY AS NEEDED (RELATIONS). 30 tablet 3  . montelukast (SINGULAIR) 10 MG tablet Take 1 tablet (10 mg total) by mouth at bedtime. 30 tablet 11  . Cholecalciferol (VITAMIN D3) 25 MCG (1000 UT) CAPS Take 1 capsule (1,000 Units total) by mouth daily. 30 capsule   . vitamin C (VITAMIN C) 500 MG tablet Take 1 tablet (500 mg total) by mouth daily.     Facility-Administered Medications Prior to Visit  Medication Dose Route Frequency Provider Last Rate Last Admin  . betamethasone acetate-betamethasone sodium phosphate (CELESTONE) injection 3 mg  3 mg Intramuscular Once 1m M, DPM      . betamethasone acetate-betamethasone sodium phosphate (CELESTONE) injection 3 mg  3 mg Intramuscular Once M, DPM         Per HPI unless specifically indicated in ROS section below Review of Systems  Constitutional: Negative for activity change, appetite change, chills, fatigue, fever and unexpected weight change.  HENT: Negative for hearing loss.   Eyes: Negative for visual disturbance.  Respiratory: Negative for cough, chest tightness, shortness of breath and wheezing.   Cardiovascular: Negative for chest pain, palpitations and leg swelling.  Gastrointestinal: Negative for abdominal distention, abdominal pain, blood in stool, constipation, diarrhea, nausea and vomiting.  Genitourinary: Negative for difficulty urinating and hematuria.  Musculoskeletal: Negative for arthralgias, myalgias and neck pain.  Skin: Negative for rash.  Neurological: Negative for dizziness, seizures, syncope and headaches.  Hematological: Negative for adenopathy. Does not bruise/bleed easily.  Psychiatric/Behavioral: Negative for dysphoric mood. The patient is not nervous/anxious.    Objective:  BP 120/76   Pulse 74   Temp 98 F (36.7 C)  (Temporal)   Ht 5' 11.5" (1.816 m)   Wt 198 lb (89.8 kg)   SpO2 96%   BMI 27.23 kg/m   Wt Readings from Last 3 Encounters:  07/21/20 198 lb (89.8 kg)  07/05/20 185 lb (83.9 kg)  05/28/19 198 lb (89.8 kg)      Physical Exam Vitals and nursing note reviewed.  Constitutional:      General: He is not in acute distress.    Appearance: Normal appearance. He is well-developed. He is not ill-appearing.  HENT:     Head: Normocephalic and atraumatic.     Right Ear: Hearing, tympanic membrane, ear canal and external ear normal.     Left Ear: Hearing, tympanic membrane, ear canal and external ear normal.  Eyes:     General: No scleral icterus.    Extraocular Movements: Extraocular movements intact.     Conjunctiva/sclera: Conjunctivae normal.     Pupils: Pupils are equal, round, and reactive to light.  Neck:     Thyroid: No thyroid mass or thyromegaly.  Cardiovascular:     Rate and Rhythm: Normal rate and regular rhythm.     Pulses: Normal pulses.          Radial pulses are 2+ on the right side and 2+ on the left side.     Heart sounds: Normal heart sounds. No murmur heard.   Pulmonary:     Effort: Pulmonary effort is normal. No respiratory distress.     Breath sounds: Normal breath sounds. No wheezing, rhonchi or rales.  Abdominal:     General: Abdomen is flat. Bowel sounds are normal. There is no distension.     Palpations: Abdomen is soft. There is no mass.     Tenderness: There is no abdominal tenderness. There is no guarding or rebound.     Hernia: No hernia is present.  Musculoskeletal:        General: Normal range of motion.     Cervical back: Normal range of motion and neck supple.     Right lower leg: No edema.     Left lower leg: No edema.  Lymphadenopathy:     Cervical: No cervical adenopathy.  Skin:    General: Skin is warm and dry.     Findings: No rash.  Neurological:     General: No focal deficit present.     Mental Status: He is alert and oriented to person,  place, and time.     Comments: CN grossly intact, station and gait intact  Psychiatric:        Mood and Affect: Mood normal.        Behavior: Behavior normal.        Thought Content: Thought content normal.        Judgment: Judgment normal.       Results for orders placed or performed in visit on 07/13/20  PSA  Result Value Ref Range   PSA 0.87 0.10 - 4.00 ng/mL  Comprehensive metabolic panel  Result Value Ref Range   Sodium 142 135 - 145 mEq/L   Potassium 4.0 3.5 -  5.1 mEq/L   Chloride 107 96 - 112 mEq/L   CO2 29 19 - 32 mEq/L   Glucose, Bld 100 (H) 70 - 99 mg/dL   BUN 13 6 - 23 mg/dL   Creatinine, Ser 5.00 0.40 - 1.50 mg/dL   Total Bilirubin 0.5 0.2 - 1.2 mg/dL   Alkaline Phosphatase 57 39 - 117 U/L   AST 13 0 - 37 U/L   ALT 14 0 - 53 U/L   Total Protein 6.3 6.0 - 8.3 g/dL   Albumin 4.0 3.5 - 5.2 g/dL   GFR 93.81 >82.99 mL/min   Calcium 9.1 8.4 - 10.5 mg/dL  Lipid panel  Result Value Ref Range   Cholesterol 177 0 - 200 mg/dL   Triglycerides 371.6 (H) 0.0 - 149.0 mg/dL   HDL 96.78 (L) >93.81 mg/dL   VLDL 01.7 0.0 - 51.0 mg/dL   LDL Cholesterol 258 (H) 0 - 99 mg/dL   Total CHOL/HDL Ratio 5    NonHDL 144.05    Assessment & Plan:  This visit occurred during the SARS-CoV-2 public health emergency.  Safety protocols were in place, including screening questions prior to the visit, additional usage of staff PPE, and extensive cleaning of exam room while observing appropriate contact time as indicated for disinfecting solutions.   Problem List Items Addressed This Visit    ED (erectile dysfunction)    Generic sildenafil effective - continue      Seasonal allergies    Discussed flonase, singulair, claritin and beandryl use.       Health maintenance examination - Primary    Preventative protocols reviewed and updated unless pt declined. Discussed healthy diet and lifestyle.       Smoker    Encouraged full cessation - contemplative. Has cut down to 1/2 ppd. Interested  in lung cancer screen - will refer.       Relevant Orders   Ambulatory Referral for Lung Cancer Scre   Osteoarthritis    Continues ibuprofen 800mg  about 3 times a week, takes with pepcid.  This has replaced Duexis (became unaffordable)       Dyslipidemia    Chronic, off meds. No fmhx CAD or CVA.  Reviewed ASCVD risk, discussed statin option - he prefers to focus on quitting smoking this year.  The 10-year ASCVD risk score DC Denman George., et al., 2013) is: 14.2%   Values used to calculate the score:     Age: 57 years     Sex: Male     Is Non-Hispanic African American: No     Diabetic: No     Tobacco smoker: Yes     Systolic Blood Pressure: 120 mmHg     Is BP treated: No     HDL Cholesterol: 32.6 mg/dL     Total Cholesterol: 177 mg/dL           Meds ordered this encounter  Medications  . montelukast (SINGULAIR) 10 MG tablet    Sig: Take 1 tablet (10 mg total) by mouth at bedtime.    Dispense:  30 tablet    Refill:  11   Orders Placed This Encounter  Procedures  . Ambulatory Referral for Lung Cancer Scre    Referral Priority:   Routine    Referral Type:   Consultation    Referral Reason:   Specialty Services Required    Number of Visits Requested:   1    Patient instructions: Work on quitting smoking We will refer you to lung cancer  screening program.  Call us with dates of booster and first 2 Pfizer shots.  Consider shingrix vaccine series - either here or at pharmacy.  You are doing well today.  Return as needed or in 1 year for next physical.   Follow up plan: Return in about 1 year (around 07/21/2021) for annual exam, prior fasting for blood work.  Eustaquio BoydenJavier Rosalia Mcavoy, MD

## 2020-07-21 NOTE — Patient Instructions (Addendum)
Work on quitting smoking We will refer you to lung cancer screening program.  Call us with dates of booster and first 2 Pfizer shots.  Consider shingrix vaccine series - either here or at pharmacy.  You are doing well today.  Return as needed or in 1 year for next physical.   Health Maintenance, Male Adopting a healthy lifestyle and getting preventive care are important in promoting health and wellness. Ask your health care provider about:  The right schedule for you to have regular tests and exams.  Things you can do on your own to prevent diseases and keep yourself healthy. What should I know about diet, weight, and exercise? Eat a healthy diet  Eat a diet that includes plenty of vegetables, fruits, low-fat dairy products, and lean protein.  Do not eat a lot of foods that are high in solid fats, added sugars, or sodium.   Maintain a healthy weight Body mass index (BMI) is a measurement that can be used to identify possible weight problems. It estimates body fat based on height and weight. Your health care provider can help determine your BMI and help you achieve or maintain a healthy weight. Get regular exercise Get regular exercise. This is one of the most important things you can do for your health. Most adults should:  Exercise for at least 150 minutes each week. The exercise should increase your heart rate and make you sweat (moderate-intensity exercise).  Do strengthening exercises at least twice a week. This is in addition to the moderate-intensity exercise.  Spend less time sitting. Even light physical activity can be beneficial. Watch cholesterol and blood lipids Have your blood tested for lipids and cholesterol at 60 years of age, then have this test every 5 years. You may need to have your cholesterol levels checked more often if:  Your lipid or cholesterol levels are high.  You are older than 60 years of age.  You are at high risk for heart disease. What should I  know about cancer screening? Many types of cancers can be detected early and may often be prevented. Depending on your health history and family history, you may need to have cancer screening at various ages. This may include screening for:  Colorectal cancer.  Prostate cancer.  Skin cancer.  Lung cancer. What should I know about heart disease, diabetes, and high blood pressure? Blood pressure and heart disease  High blood pressure causes heart disease and increases the risk of stroke. This is more likely to develop in people who have high blood pressure readings, are of African descent, or are overweight.  Talk with your health care provider about your target blood pressure readings.  Have your blood pressure checked: ? Every 3-5 years if you are 61-49 years of age. ? Every year if you are 97 years old or older.  If you are between the ages of 61 and 24 and are a current or former smoker, ask your health care provider if you should have a one-time screening for abdominal aortic aneurysm (AAA). Diabetes Have regular diabetes screenings. This checks your fasting blood sugar level. Have the screening done:  Once every three years after age 39 if you are at a normal weight and have a low risk for diabetes.  More often and at a younger age if you are overweight or have a high risk for diabetes. What should I know about preventing infection? Hepatitis B If you have a higher risk for hepatitis B, you should be  screened for this virus. Talk with your health care provider to find out if you are at risk for hepatitis B infection. Hepatitis C Blood testing is recommended for:  Everyone born from [birth year] through 1965.  Anyone with known risk factors for hepatitis C. Sexually transmitted infections (STIs)  You should be screened each year for STIs, including gonorrhea and chlamydia, if: ? You are sexually active and are younger than 60 years of age. ? You are older than 60 years of age and  your health care provider tells you that you are at risk for this type of infection. ? Your sexual activity has changed since you were last screened, and you are at increased risk for chlamydia or gonorrhea. Ask your health care provider if you are at risk.  Ask your health care provider about whether you are at high risk for HIV. Your health care provider may recommend a prescription medicine to help prevent HIV infection. If you choose to take medicine to prevent HIV, you should first get tested for HIV. You should then be tested every 3 months for as long as you are taking the medicine. Follow these instructions at home: Lifestyle  Do not use any products that contain nicotine or tobacco, such as cigarettes, e-cigarettes, and chewing tobacco. If you need help quitting, ask your health care provider.  Do not use street drugs.  Do not share needles.  Ask your health care provider for help if you need support or information about quitting drugs. Alcohol use  Do not drink alcohol if your health care provider tells you not to drink.  If you drink alcohol: ? Limit how much you have to 0-2 drinks a day. ? Be aware of how much alcohol is in your drink. In the [place] one drink equals one 12 oz bottle of beer (355 mL), one 5 oz glass of wine (148 mL), or one 1 oz glass of hard liquor (44 mL). General instructions  Schedule regular health, dental, and eye exams.  Stay current with your vaccines.  Tell your health care provider if: ? You often feel depressed. ? You have ever been abused or do not feel safe at home. Summary  Adopting a healthy lifestyle and getting preventive care are important in promoting health and wellness.  Follow your health care provider's instructions about healthy diet, exercising, and getting tested or screened for diseases.  Follow your health care provider's instructions on monitoring your cholesterol and blood pressure. This information is not intended to  replace advice given to you by your health care provider. Make sure you discuss any questions you have with your health care provider. Document Revised: 03/27/2018 Document Reviewed: 03/27/2018 Elsevier Patient Education  2021 [employer].

## 2020-07-21 NOTE — Assessment & Plan Note (Signed)
Continues ibuprofen 800mg  about 3 times a week, takes with pepcid.  This has replaced Duexis (became unaffordable)

## 2020-07-21 NOTE — Assessment & Plan Note (Signed)
Encouraged full cessation - contemplative. Has cut down to 1/2 ppd. Interested in lung cancer screen - will refer.

## 2020-07-21 NOTE — Assessment & Plan Note (Addendum)
Generic sildenafil effective - continue

## 2020-07-21 NOTE — Assessment & Plan Note (Signed)
Preventative protocols reviewed and updated unless pt declined. Discussed healthy diet and lifestyle.  

## 2020-07-21 NOTE — Assessment & Plan Note (Addendum)
Chronic, off meds. No fmhx CAD or CVA.  Reviewed ASCVD risk, discussed statin option - he prefers to focus on quitting smoking this year.  The 10-year ASCVD risk score Denman George DC Montez Hageman., et al., 2013) is: 14.2%   Values used to calculate the score:     Age: 60 years     Sex: Male     Is Non-Hispanic African American: No     Diabetic: No     Tobacco smoker: Yes     Systolic Blood Pressure: 120 mmHg     Is BP treated: No     HDL Cholesterol: 32.6 mg/dL     Total Cholesterol: 177 mg/dL

## 2020-07-21 NOTE — Assessment & Plan Note (Signed)
Discussed flonase, singulair, claritin and beandryl use.

## 2020-07-22 ENCOUNTER — Telehealth: Payer: Self-pay | Admitting: *Deleted

## 2020-07-22 NOTE — Telephone Encounter (Signed)
Received referral for low dose lung cancer screening CT scan. Message left at phone number listed in EMR for patient to call me back to facilitate scheduling scan.  

## 2020-07-30 ENCOUNTER — Encounter: Payer: Self-pay | Admitting: *Deleted

## 2020-07-30 ENCOUNTER — Telehealth: Payer: Self-pay | Admitting: *Deleted

## 2020-07-30 DIAGNOSIS — Z122 Encounter for screening for malignant neoplasm of respiratory organs: Secondary | ICD-10-CM

## 2020-07-30 DIAGNOSIS — F17200 Nicotine dependence, unspecified, uncomplicated: Secondary | ICD-10-CM

## 2020-07-30 DIAGNOSIS — Z87891 Personal history of nicotine dependence: Secondary | ICD-10-CM

## 2020-07-30 NOTE — Telephone Encounter (Signed)
Received referral for initial lung cancer screening scan. Contacted patient and obtained smoking history,(current smoker, 1 ppd x 25 years) as well as answering questions related to screening process. Patient denies signs of lung cancer such as weight loss or hemoptysis. Patient denies comorbidity that would prevent curative treatment if lung cancer were found. Patient is scheduled for shared decision making visit and CT scan on 08/31/20 @ 1:45 pm.

## 2020-08-31 ENCOUNTER — Ambulatory Visit
Admission: RE | Admit: 2020-08-31 | Discharge: 2020-08-31 | Disposition: A | Discharge status: Home / Self Care | Payer: 59 | Source: Ambulatory Visit | Attending: Nurse Practitioner | Admitting: Nurse Practitioner

## 2020-08-31 ENCOUNTER — Inpatient Hospital Stay: Payer: 59 | Attending: Nurse Practitioner | Admitting: Hospice and Palliative Medicine

## 2020-08-31 ENCOUNTER — Other Ambulatory Visit: Payer: Self-pay

## 2020-08-31 DIAGNOSIS — Z87891 Personal history of nicotine dependence: Secondary | ICD-10-CM | POA: Insufficient documentation

## 2020-08-31 DIAGNOSIS — Z122 Encounter for screening for malignant neoplasm of respiratory organs: Secondary | ICD-10-CM

## 2020-08-31 DIAGNOSIS — F17200 Nicotine dependence, unspecified, uncomplicated: Secondary | ICD-10-CM | POA: Insufficient documentation

## 2020-08-31 DIAGNOSIS — F17210 Nicotine dependence, cigarettes, uncomplicated: Secondary | ICD-10-CM | POA: Diagnosis not present

## 2020-08-31 DIAGNOSIS — Z716 Tobacco abuse counseling: Secondary | ICD-10-CM

## 2020-08-31 NOTE — Progress Notes (Signed)
Virtual Visit via Video Note  I connected with@ on 08/31/20 at@ by a video enabled telemedicine application and verified that I am speaking with the correct person using two identifiers.   I discussed the limitations of evaluation and management by telemedicine and the availability of in person appointments. The patient expressed understanding and agreed to proceed.  Location: Patient: [facility] Provider: Clinic   In accordance with CMS guidelines, patient has met eligibility criteria including age, absence of signs or symptoms of lung cancer.  Social History   Tobacco Use  . Smoking status: Current Every Day Smoker    Packs/day: 1.00    Years: 25.00    Pack years: 25.00    Types: Cigarettes  . Smokeless tobacco: Never Used  Vaping Use  . Vaping Use: Never used  Substance Use Topics  . Alcohol use: Yes    Comment: none last 24 hrs  . Drug use: No      A shared decision-making session was conducted prior to the performance of CT scan. This includes one or more decision aids, includes benefits and harms of screening, follow-up diagnostic testing, over-diagnosis, false positive rate, and total radiation exposure.   Counseling on the importance of adherence to annual lung cancer LDCT screening, impact of co-morbidities, and ability or willingness to undergo diagnosis and treatment is imperative for compliance of the program.   Counseling on the importance of continued smoking cessation for former smokers; the importance of smoking cessation for current smokers, and information about tobacco cessation interventions have been given to patient including [place] and [facility] quit [place] programs.   Written order for lung cancer screening with LDCT has been given to the patient and any and all questions have been answered to the best of my abilities.    Yearly follow up will be coordinated by Burgess Estelle, Thoracic Navigator.  Time Total: 15 minutes  Visit consisted of counseling  and education dealing with complex health screening. Greater than 50%  of this time was spent counseling and coordinating care related to the above assessment and plan.  Signed by: Altha Harm, PhD, NP-C

## 2020-09-07 ENCOUNTER — Encounter: Payer: Self-pay | Admitting: *Deleted

## 2020-11-03 ENCOUNTER — Other Ambulatory Visit: Payer: Self-pay | Admitting: Family Medicine

## 2021-01-07 ENCOUNTER — Other Ambulatory Visit: Payer: Self-pay | Admitting: Family Medicine

## 2021-04-14 ENCOUNTER — Other Ambulatory Visit: Payer: Self-pay | Admitting: Family Medicine

## 2021-04-28 ENCOUNTER — Other Ambulatory Visit: Payer: Self-pay | Admitting: Family Medicine

## 2021-06-12 ENCOUNTER — Other Ambulatory Visit: Payer: Self-pay | Admitting: Family Medicine

## 2021-06-28 ENCOUNTER — Other Ambulatory Visit: Payer: Self-pay | Admitting: Family Medicine

## 2021-07-19 ENCOUNTER — Other Ambulatory Visit: Payer: 59

## 2021-07-20 ENCOUNTER — Other Ambulatory Visit: Payer: Self-pay | Admitting: Family Medicine

## 2021-07-26 ENCOUNTER — Encounter: Payer: 59 | Admitting: Family Medicine

## 2021-09-17 ENCOUNTER — Other Ambulatory Visit: Payer: Self-pay | Admitting: Family Medicine

## 2021-09-17 DIAGNOSIS — Z125 Encounter for screening for malignant neoplasm of prostate: Secondary | ICD-10-CM

## 2021-09-17 DIAGNOSIS — E785 Hyperlipidemia, unspecified: Secondary | ICD-10-CM

## 2021-09-17 DIAGNOSIS — E538 Deficiency of other specified B group vitamins: Secondary | ICD-10-CM | POA: Insufficient documentation

## 2021-09-20 ENCOUNTER — Other Ambulatory Visit (INDEPENDENT_AMBULATORY_CARE_PROVIDER_SITE_OTHER): Payer: 59

## 2021-09-20 DIAGNOSIS — Z125 Encounter for screening for malignant neoplasm of prostate: Secondary | ICD-10-CM

## 2021-09-20 DIAGNOSIS — E785 Hyperlipidemia, unspecified: Secondary | ICD-10-CM | POA: Diagnosis not present

## 2021-09-20 DIAGNOSIS — E538 Deficiency of other specified B group vitamins: Secondary | ICD-10-CM | POA: Diagnosis not present

## 2021-09-20 LAB — LIPID PANEL
Cholesterol: 195 mg/dL (ref 0–200)
HDL: 31.1 mg/dL — ABNORMAL LOW (ref >39.00)
NonHDL: 163.95
Total CHOL/HDL Ratio: 6
Triglycerides: 232 mg/dL — ABNORMAL HIGH (ref 0.0–149.0)
VLDL: 46.4 mg/dL — ABNORMAL HIGH (ref 0.0–40.0)

## 2021-09-20 LAB — CBC WITH DIFFERENTIAL/PLATELET
Basophils Absolute: 0.1 10*3/uL (ref 0.0–0.1)
Basophils Relative: 1.1 % (ref 0.0–3.0)
Eosinophils Absolute: 0.2 10*3/uL (ref 0.0–0.7)
Eosinophils Relative: 3.7 % (ref 0.0–5.0)
HCT: 42.1 % (ref 39.0–52.0)
Hemoglobin: 15 g/dL (ref 13.0–17.0)
Lymphocytes Relative: 28.9 % (ref 12.0–46.0)
Lymphs Abs: 1.8 10*3/uL (ref 0.7–4.0)
MCHC: 35.7 g/dL (ref 30.0–36.0)
MCV: 102.3 fl — ABNORMAL HIGH (ref 78.0–100.0)
Monocytes Absolute: 0.4 10*3/uL (ref 0.1–1.0)
Monocytes Relative: 6.1 % (ref 3.0–12.0)
Neutro Abs: 3.8 10*3/uL (ref 1.4–7.7)
Neutrophils Relative %: 60.2 % (ref 43.0–77.0)
Platelets: 180 10*3/uL (ref 150.0–400.0)
RBC: 4.11 Mil/uL — ABNORMAL LOW (ref 4.22–5.81)
RDW: 12.8 % (ref 11.5–15.5)
WBC: 6.2 10*3/uL (ref 4.0–10.5)

## 2021-09-20 LAB — COMPREHENSIVE METABOLIC PANEL
ALT: 16 U/L (ref 0–53)
AST: 14 U/L (ref 0–37)
Albumin: 3.9 g/dL (ref 3.5–5.2)
Alkaline Phosphatase: 50 U/L (ref 39–117)
BUN: 13 mg/dL (ref 6–23)
CO2: 27 mEq/L (ref 19–32)
Calcium: 9 mg/dL (ref 8.4–10.5)
Chloride: 107 mEq/L (ref 96–112)
Creatinine, Ser: 0.96 mg/dL (ref 0.40–1.50)
GFR: 85.67 mL/min (ref >60.00)
Glucose, Bld: 95 mg/dL (ref 70–99)
Potassium: 4.1 mEq/L (ref 3.5–5.1)
Sodium: 141 mEq/L (ref 135–145)
Total Bilirubin: 0.8 mg/dL (ref 0.2–1.2)
Total Protein: 6.1 g/dL (ref 6.0–8.3)

## 2021-09-20 LAB — PSA: PSA: 0.87 ng/mL (ref 0.10–4.00)

## 2021-09-20 LAB — LDL CHOLESTEROL, DIRECT: Direct LDL: 127 mg/dL

## 2021-09-20 LAB — VITAMIN B12: Vitamin B-12: 248 pg/mL (ref 211–911)

## 2021-09-26 ENCOUNTER — Ambulatory Visit (INDEPENDENT_AMBULATORY_CARE_PROVIDER_SITE_OTHER): Payer: 59 | Admitting: Family Medicine

## 2021-09-26 ENCOUNTER — Encounter: Payer: Self-pay | Admitting: Family Medicine

## 2021-09-26 ENCOUNTER — Ambulatory Visit (INDEPENDENT_AMBULATORY_CARE_PROVIDER_SITE_OTHER)
Admission: RE | Admit: 2021-09-26 | Discharge: 2021-09-26 | Disposition: A | Discharge status: Home / Self Care | Payer: 59 | Source: Ambulatory Visit | Attending: Family Medicine | Admitting: Family Medicine

## 2021-09-26 VITALS — BP 134/82 | HR 63 | Temp 97.3°F | Ht 71.5 in | Wt 200.5 lb

## 2021-09-26 DIAGNOSIS — R519 Headache, unspecified: Secondary | ICD-10-CM | POA: Insufficient documentation

## 2021-09-26 DIAGNOSIS — I2510 Atherosclerotic heart disease of native coronary artery without angina pectoris: Secondary | ICD-10-CM | POA: Insufficient documentation

## 2021-09-26 DIAGNOSIS — F17200 Nicotine dependence, unspecified, uncomplicated: Secondary | ICD-10-CM

## 2021-09-26 DIAGNOSIS — J449 Chronic obstructive pulmonary disease, unspecified: Secondary | ICD-10-CM

## 2021-09-26 DIAGNOSIS — M5030 Other cervical disc degeneration, unspecified cervical region: Secondary | ICD-10-CM

## 2021-09-26 DIAGNOSIS — E538 Deficiency of other specified B group vitamins: Secondary | ICD-10-CM

## 2021-09-26 DIAGNOSIS — Z0001 Encounter for general adult medical examination with abnormal findings: Secondary | ICD-10-CM | POA: Diagnosis not present

## 2021-09-26 DIAGNOSIS — E785 Hyperlipidemia, unspecified: Secondary | ICD-10-CM | POA: Diagnosis not present

## 2021-09-26 DIAGNOSIS — I700 Atherosclerosis of aorta: Secondary | ICD-10-CM | POA: Insufficient documentation

## 2021-09-26 DIAGNOSIS — M542 Cervicalgia: Secondary | ICD-10-CM | POA: Diagnosis not present

## 2021-09-26 MED ORDER — CHANTIX STARTING MONTH PAK 0.5 MG X 11 & 1 MG X 42 PO TBPK: ORAL_TABLET | ORAL | 0 refills | Status: DC

## 2021-09-26 MED ORDER — VITAMIN B-12 1000 MCG PO TABS: 1000.0000 ug | ORAL_TABLET | Freq: Every day | ORAL | Status: DC

## 2021-09-26 MED ORDER — ATORVASTATIN CALCIUM 20 MG PO TABS: 20.0000 mg | ORAL_TABLET | Freq: Every day | ORAL | 3 refills | Status: DC

## 2021-09-26 MED ORDER — VARENICLINE TARTRATE 1 MG PO TABS: 1.0000 mg | ORAL_TABLET | Freq: Two times a day (BID) | ORAL | 1 refills | Status: DC

## 2021-09-26 MED ORDER — MONTELUKAST SODIUM 10 MG PO TABS: 10.0000 mg | ORAL_TABLET | Freq: Every day | ORAL | 11 refills | Status: DC

## 2021-09-26 NOTE — Assessment & Plan Note (Signed)
Discussed. Pt asxs, denies fmhx CAD.  Start statin. Check EKG.

## 2021-09-26 NOTE — Assessment & Plan Note (Signed)
Preventative protocols reviewed and updated unless pt declined. Discussed healthy diet and lifestyle.  

## 2021-09-26 NOTE — Assessment & Plan Note (Addendum)
Discussed. Start statin.

## 2021-09-26 NOTE — Assessment & Plan Note (Signed)
Encouraged full cessation. He is interested in smoking cessation assistance. Discussed options including NRT, wellbutrin, chantix. He is interested in trial of chantix. Reviewed possible side effects and adverse effects to watch for including increased cardiovascular risk, increased irritability/agitation, vivid dreams, nausea/vomiting/HA. Update with effect or if any difficulty tolerating mecciation.  Due for lung cancer screening CT.

## 2021-09-26 NOTE — Assessment & Plan Note (Signed)
With macrocytosis and fatigue. Start b12 500-1087mcg daily.

## 2021-09-26 NOTE — Assessment & Plan Note (Signed)
Update cervical films.

## 2021-09-26 NOTE — Patient Instructions (Addendum)
EKG today  Neck xrays today and we will refer you to neurologist in [place].  Chantix sent to pharmacy.  Start vitamin b12 500-1085mcg daily over the counter as your levels were low normal - this may help energy levels.  Try atorvastatin 20mg  daily for cholesterol control. Return in 6 months for follow up visit.   Health Maintenance, Male Adopting a healthy lifestyle and getting preventive care are important in promoting health and wellness. Ask your health care provider about: The right schedule for you to have regular tests and exams. Things you can do on your own to prevent diseases and keep yourself healthy. What should I know about diet, weight, and exercise? Eat a healthy diet  Eat a diet that includes plenty of vegetables, fruits, low-fat dairy products, and lean protein. Do not eat a lot of foods that are high in solid fats, added sugars, or sodium. Maintain a healthy weight Body mass index (BMI) is a measurement that can be used to identify possible weight problems. It estimates body fat based on height and weight. Your health care provider can help determine your BMI and help you achieve or maintain a healthy weight. Get regular exercise Get regular exercise. This is one of the most important things you can do for your health. Most adults should: Exercise for at least 150 minutes each week. The exercise should increase your heart rate and make you sweat (moderate-intensity exercise). Do strengthening exercises at least twice a week. This is in addition to the moderate-intensity exercise. Spend less time sitting. Even light physical activity can be beneficial. Watch cholesterol and blood lipids Have your blood tested for lipids and cholesterol at 60 years of age, then have this test every 5 years. You may need to have your cholesterol levels checked more often if: Your lipid or cholesterol levels are high. You are older than 61 years of age. You are at high risk for heart  disease. What should I know about cancer screening? Many types of cancers can be detected early and may often be prevented. Depending on your health history and family history, you may need to have cancer screening at various ages. This may include screening for: Colorectal cancer. Prostate cancer. Skin cancer. Lung cancer. What should I know about heart disease, diabetes, and high blood pressure? Blood pressure and heart disease High blood pressure causes heart disease and increases the risk of stroke. This is more likely to develop in people who have high blood pressure readings or are overweight. Talk with your health care provider about your target blood pressure readings. Have your blood pressure checked: Every 3-5 years if you are 16-16 years of age. Every year if you are 3 years old or older. If you are between the ages of 43 and 9 and are a current or former smoker, ask your health care provider if you should have a one-time screening for abdominal aortic aneurysm (AAA). Diabetes Have regular diabetes screenings. This checks your fasting blood sugar level. Have the screening done: Once every three years after age 48 if you are at a normal weight and have a low risk for diabetes. More often and at a younger age if you are overweight or have a high risk for diabetes. What should I know about preventing infection? Hepatitis B If you have a higher risk for hepatitis B, you should be screened for this virus. Talk with your health care provider to find out if you are at risk for hepatitis B infection. Hepatitis  C Blood testing is recommended for: Everyone born from [birth year] through 1965. Anyone with known risk factors for hepatitis C. Sexually transmitted infections (STIs) You should be screened each year for STIs, including gonorrhea and chlamydia, if: You are sexually active and are younger than 61 years of age. You are older than 61 years of age and your health care provider tells you  that you are at risk for this type of infection. Your sexual activity has changed since you were last screened, and you are at increased risk for chlamydia or gonorrhea. Ask your health care provider if you are at risk. Ask your health care provider about whether you are at high risk for HIV. Your health care provider may recommend a prescription medicine to help prevent HIV infection. If you choose to take medicine to prevent HIV, you should first get tested for HIV. You should then be tested every 3 months for as long as you are taking the medicine. Follow these instructions at home: Alcohol use Do not drink alcohol if your health care provider tells you not to drink. If you drink alcohol: Limit how much you have to 0-2 drinks a day. Know how much alcohol is in your drink. In the [place] one drink equals one 12 oz bottle of beer (355 mL), one 5 oz glass of wine (148 mL), or one 1 oz glass of hard liquor (44 mL). Lifestyle Do not use any products that contain nicotine or tobacco. These products include cigarettes, chewing tobacco, and vaping devices, such as e-cigarettes. If you need help quitting, ask your health care provider. Do not use street drugs. Do not share needles. Ask your health care provider for help if you need support or information about quitting drugs. General instructions Schedule regular health, dental, and eye exams. Stay current with your vaccines. Tell your health care provider if: You often feel depressed. You have ever been abused or do not feel safe at home. Summary Adopting a healthy lifestyle and getting preventive care are important in promoting health and wellness. Follow your health care provider's instructions about healthy diet, exercising, and getting tested or screened for diseases. Follow your health care provider's instructions on monitoring your cholesterol and blood pressure. This information is not intended to replace advice given to you by your health  care provider. Make sure you discuss any questions you have with your health care provider. Document Revised: 08/23/2020 Document Reviewed: 08/23/2020 Elsevier Patient Education  2023 [employer].

## 2021-09-26 NOTE — Assessment & Plan Note (Addendum)
By lung cancer screenig CT. Pt asxs. Monitor for now.

## 2021-09-26 NOTE — Assessment & Plan Note (Addendum)
Not on medication.  No fmhx CAD or CVA.  Reviewed ASCVD, best thing to lower risk is quit smoking - see above.  Interested in trying atorvastatin 20mg . Will monitor for myalgias. The 10-year ASCVD risk score (Arnett DK, et al., 2019) is: 19.9%   Values used to calculate the score:     Age: 61 years     Sex: Male     Is Non-Hispanic African American: No     Diabetic: No     Tobacco smoker: Yes     Systolic Blood Pressure: 134 mmHg     Is BP treated: No     HDL Cholesterol: 31.1 mg/dL     Total Cholesterol: 195 mg/dL

## 2021-09-26 NOTE — Assessment & Plan Note (Addendum)
Describes worsening occipital headache with radiation to vertex, which could be consistent with cervicogenic HA in h/o cervical DDD. However concerning that HA worsen with cough ?mechanical neck issue from forceful cough vs increase in intracranial pressure. Start with cervical neck films today. In h/o spontaneous CSF leak hx, will also refer to neurology for further evaluation. Today's HA is not an orthostatic headache.

## 2021-09-26 NOTE — Progress Notes (Addendum)
Patient ID: Gregory Johnson, male    DOB: [birth year], 61 y.o.   MRN: [id number]  This visit was conducted in person.  BP 134/82   Pulse 63   Temp (!) 97.3 F (36.3 C) (Temporal)   Ht 5' 11.5" (1.816 m)   Wt 200 lb 8 oz (90.9 kg)   SpO2 96%   BMI 27.57 kg/m    CC: CPE Subjective:   HPI: Gregory Johnson is a 61 y.o. 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   Over the past month noticing decreased energy levels as well as 6-7 mo h/o headache with coughing that starts in occipital head with radiation to vertex. HA lasts about 30 mins, manages with ibuprofen 800mg  - now taking daily. Notes ongoing neck tightness, told h/o cervical spine arthritis. 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 Headaches come on randomly, worse after active day. Hasn't noted triggers besides hard cough.   CT chest lung cancer screening 08/2020 - CAD (LAD) as well as aortic ATH, mild centrilobular and paraseptal emphysema.   Preventative: Colonoscopy 06/2020 - diverticulosis, ext hem, rpt 10 yrs ([place])  Prostate screening - yearly screen. No prostate symptoms.  Lung cancer screening - started 08/2020 Flu - declines  COVID vaccine Pfizer x2, no booster Tdap 2012, Td 2016  Shingrix - discussed, declines  Seat belt use discussed  Sunscreen use discussed. No changing moles.  Sleep - averaging 8 hours/night Smoking - 1/2ppd. Had quit 2020, restarted 2021. >20 PY hx. interested in quitting - discussed options, interested in chantix.  Alcohol - 3 drinks/wk Dentist q6 mo  Eye exam yearly   Caffeine: 2 cups coffee, 1 coke daily, lots of soda   Lives with wife, 2 daughters who recently graduated college, 3 dogs   Edu: some college   Occupation: Airline pilot, retired  Activity: walks, treadmill, gym  Diet: good water, daily fruits/vegetables      Relevant past medical, surgical,  family and social history reviewed and updated as indicated. Interim medical history since our last visit reviewed. Allergies and medications reviewed and updated. Outpatient Medications Prior to Visit  Medication Sig Dispense Refill   famotidine (PEPCID) 20 MG tablet Take 1 tablet (20 mg total) by mouth 2 (two) times daily as needed for heartburn or indigestion (take with ibuprofen). 90 tablet 1   fluticasone (FLONASE) 50 MCG/ACT nasal spray PLACE 2 SPRAYS INTO BOTH NOSTRILS AT BEDTIME. 16 mL 6   ibuprofen (ADVIL) 800 MG tablet TAKE 1 TABLET BY MOUTH EVERY 8 HOURS AS NEEDED 90 tablet 0   Multiple Vitamins-Minerals (CENTRUM SILVER 50+MEN) TABS Take 1 tablet by mouth daily.     sildenafil (REVATIO) 20 MG tablet TAKE 2-5 TABLETS (40-100 MG TOTAL) BY MOUTH DAILY AS NEEDED (RELATIONS). 30 tablet 3   montelukast (SINGULAIR) 10 MG tablet Take 1 tablet (10 mg total) by mouth at bedtime. 30 tablet 11   Facility-Administered Medications Prior to Visit  Medication Dose Route Frequency Provider Last Rate Last Admin   betamethasone acetate-betamethasone sodium phosphate (CELESTONE) injection 3 mg  3 mg Intramuscular Once Daylene Katayama M, DPM       betamethasone acetate-betamethasone sodium phosphate (CELESTONE) injection 3 mg  3 mg Intramuscular Once Edrick Kins, DPM         Per HPI unless specifically indicated in ROS section below Review of Systems  Constitutional:  Negative for  activity change, appetite change, chills, fatigue, fever and unexpected weight change.  HENT:  Negative for hearing loss.   Eyes:  Negative for visual disturbance.  Respiratory:  Negative for cough, chest tightness, shortness of breath and wheezing.   Cardiovascular:  Negative for chest pain, palpitations and leg swelling.  Gastrointestinal:  Negative for abdominal distention, abdominal pain, blood in stool, constipation, diarrhea, nausea and vomiting.  Genitourinary:  Negative for difficulty urinating and hematuria.   Musculoskeletal:  Negative for arthralgias, myalgias and neck pain.  Skin:  Negative for rash.  Neurological:  Positive for headaches (see HPI). Negative for dizziness, seizures and syncope.  Hematological:  Negative for adenopathy. Does not bruise/bleed easily.  Psychiatric/Behavioral:  Negative for dysphoric mood. The patient is not nervous/anxious.     Objective:  BP 134/82   Pulse 63   Temp (!) 97.3 F (36.3 C) (Temporal)   Ht 5' 11.5" (1.816 m)   Wt 200 lb 8 oz (90.9 kg)   SpO2 96%   BMI 27.57 kg/m   Wt Readings from Last 3 Encounters:  09/26/21 200 lb 8 oz (90.9 kg)  08/31/20 200 lb (90.7 kg)  07/21/20 198 lb (89.8 kg)      Physical Exam Vitals and nursing note reviewed.  Constitutional:      General: He is not in acute distress.    Appearance: Normal appearance. He is well-developed. He is not ill-appearing.  HENT:     Head: Normocephalic and atraumatic.     Right Ear: Hearing, tympanic membrane, ear canal and external ear normal.     Left Ear: Hearing, tympanic membrane, ear canal and external ear normal.  Eyes:     General: No scleral icterus.    Extraocular Movements: Extraocular movements intact.     Conjunctiva/sclera: Conjunctivae normal.     Pupils: Pupils are equal, round, and reactive to light.  Neck:     Thyroid: No thyroid mass or thyromegaly.     Vascular: No carotid bruit.     Comments:  Limited ROM L neck rotation due to discomfort No midline cervical neck discomfort  Cardiovascular:     Rate and Rhythm: Normal rate and regular rhythm.     Pulses: Normal pulses.          Radial pulses are 2+ on the right side and 2+ on the left side.     Heart sounds: Normal heart sounds. No murmur heard. Pulmonary:     Effort: Pulmonary effort is normal. No respiratory distress.     Breath sounds: Normal breath sounds. No wheezing, rhonchi or rales.  Abdominal:     General: Bowel sounds are normal. There is no distension.     Palpations: Abdomen is soft.  There is no mass.     Tenderness: There is no abdominal tenderness. There is no guarding or rebound.     Hernia: No hernia is present.  Musculoskeletal:        General: Tenderness present. Normal range of motion.     Cervical back: Normal range of motion and neck supple.     Right lower leg: No edema.     Left lower leg: No edema.  Lymphadenopathy:     Cervical: No cervical adenopathy.  Skin:    General: Skin is warm and dry.     Findings: No rash.  Neurological:     General: No focal deficit present.     Mental Status: He is alert and oriented to person, place, and time.  Psychiatric:  Mood and Affect: Mood normal.        Behavior: Behavior normal.        Thought Content: Thought content normal.        Judgment: Judgment normal.       Results for orders placed or performed in visit on 09/20/21  CBC with Differential/Platelet  Result Value Ref Range   WBC 6.2 4.0 - 10.5 K/uL   RBC 4.11 (L) 4.22 - 5.81 Mil/uL   Hemoglobin 15.0 13.0 - 17.0 g/dL   HCT 42.1 39.0 - 52.0 %   MCV 102.3 (H) 78.0 - 100.0 fl   MCHC 35.7 30.0 - 36.0 g/dL   RDW 12.8 11.5 - 15.5 %   Platelets 180.0 150.0 - 400.0 K/uL   Neutrophils Relative % 60.2 43.0 - 77.0 %   Lymphocytes Relative 28.9 12.0 - 46.0 %   Monocytes Relative 6.1 3.0 - 12.0 %   Eosinophils Relative 3.7 0.0 - 5.0 %   Basophils Relative 1.1 0.0 - 3.0 %   Neutro Abs 3.8 1.4 - 7.7 K/uL   Lymphs Abs 1.8 0.7 - 4.0 K/uL   Monocytes Absolute 0.4 0.1 - 1.0 K/uL   Eosinophils Absolute 0.2 0.0 - 0.7 K/uL   Basophils Absolute 0.1 0.0 - 0.1 K/uL  PSA  Result Value Ref Range   PSA 0.87 0.10 - 4.00 ng/mL  Comprehensive metabolic panel  Result Value Ref Range   Sodium 141 135 - 145 mEq/L   Potassium 4.1 3.5 - 5.1 mEq/L   Chloride 107 96 - 112 mEq/L   CO2 27 19 - 32 mEq/L   Glucose, Bld 95 70 - 99 mg/dL   BUN 13 6 - 23 mg/dL   Creatinine, Ser 0.96 0.40 - 1.50 mg/dL   Total Bilirubin 0.8 0.2 - 1.2 mg/dL   Alkaline Phosphatase 50 39 - 117  U/L   AST 14 0 - 37 U/L   ALT 16 0 - 53 U/L   Total Protein 6.1 6.0 - 8.3 g/dL   Albumin 3.9 3.5 - 5.2 g/dL   GFR 85.67 >60.00 mL/min   Calcium 9.0 8.4 - 10.5 mg/dL  Lipid panel  Result Value Ref Range   Cholesterol 195 0 - 200 mg/dL   Triglycerides 232.0 (H) 0.0 - 149.0 mg/dL   HDL 31.10 (L) >39.00 mg/dL   VLDL 46.4 (H) 0.0 - 40.0 mg/dL   Total CHOL/HDL Ratio 6    NonHDL 163.95   Vitamin B12  Result Value Ref Range   Vitamin B-12 248 211 - 911 pg/mL  LDL cholesterol, direct  Result Value Ref Range   Direct LDL 127.0 mg/dL   DG Cervical Spine Complete CLINICAL DATA:  Neck pain.  EXAM: CERVICAL SPINE - COMPLETE 4+ VIEW  COMPARISON:  CT of the cervical spine 11/10/2011, report only.  FINDINGS: There is no evidence of cervical spine fracture or prevertebral soft tissue swelling. Alignment is normal. Mild degenerative endplate osteophytes are seen at C5-C6 and C6-C7. There is some neural foraminal stenosis bilaterally at C3-C4.  IMPRESSION: 1. No evidence for fracture or malalignment. 2. Mild degenerative changes.  Electronically Signed   By: Ronney Asters M.D.   On: 09/26/2021 23:48   EKG - sinus bradycardia rate 50s, normal axis, intervals, no hypertrophy or acute ST/T changes  Assessment & Plan:   Problem List Items Addressed This Visit     Encounter for general adult medical examination with abnormal findings - Primary (Chronic)    Preventative protocols reviewed and updated  unless pt declined. Discussed healthy diet and lifestyle.       Smoker    Encouraged full cessation. He is interested in smoking cessation assistance. Discussed options including NRT, wellbutrin, chantix. He is interested in trial of chantix. Reviewed possible side effects and adverse effects to watch for including increased cardiovascular risk, increased irritability/agitation, vivid dreams, nausea/vomiting/HA. Update with effect or if any difficulty tolerating mecciation.  Due for lung  cancer screening CT.       Dyslipidemia    Not on medication.  No fmhx CAD or CVA.  Reviewed ASCVD, best thing to lower risk is quit smoking - see above.  Interested in trying atorvastatin 20mg . Will monitor for myalgias. The 10-year ASCVD risk score (Arnett DK, et al., 2019) is: 19.9%   Values used to calculate the score:     Age: 65 years     Sex: Male     Is Non-Hispanic African American: No     Diabetic: No     Tobacco smoker: Yes     Systolic Blood Pressure: [id number] mmHg     Is BP treated: No     HDL Cholesterol: 31.1 mg/dL     Total Cholesterol: 195 mg/dL       Relevant Medications   atorvastatin (LIPITOR) 20 MG tablet   Other Relevant Orders   EKG 12-Lead (Completed)   Low serum vitamin B12    With macrocytosis and fatigue. Start b12 500-1043mcg daily.       Atherosclerosis of aorta ([place])    Discussed. Start statin.       Relevant Medications   atorvastatin (LIPITOR) 20 MG tablet   CAD (coronary artery disease)    Discussed. Pt asxs, denies fmhx CAD.  Start statin. Check EKG.       Relevant Medications   atorvastatin (LIPITOR) 20 MG tablet   COPD (chronic obstructive pulmonary disease) ([place])    By lung cancer screenig CT. Pt asxs. Monitor for now.       Relevant Medications   varenicline (CHANTIX CONTINUING MONTH PAK) 1 MG tablet   Varenicline Tartrate, Starter, (CHANTIX STARTING MONTH PAK) 0.5 MG X 11 & 1 MG X 42 TBPK   montelukast (SINGULAIR) 10 MG tablet   Headache    Describes worsening occipital headache with radiation to vertex, which could be consistent with cervicogenic HA in h/o cervical DDD. However concerning that HA worsen with cough ?mechanical neck issue from forceful cough vs increase in intracranial pressure. Start with cervical neck films today. In h/o spontaneous CSF leak hx, will also refer to neurology for further evaluation. Today's HA is not an orthostatic headache.       Relevant Orders   DG Cervical Spine Complete (Completed)    Ambulatory referral to Neurology   DDD (degenerative disc disease), cervical    Update cervical films.      Relevant Orders   DG Cervical Spine Complete (Completed)   Ambulatory referral to Neurology     Meds ordered this encounter  Medications   varenicline (CHANTIX CONTINUING MONTH PAK) 1 MG tablet    Sig: Take 1 tablet (1 mg total) by mouth 2 (two) times daily.    Dispense:  60 tablet    Refill:  1   Varenicline Tartrate, Starter, (CHANTIX STARTING MONTH PAK) 0.5 MG X 11 & 1 MG X 42 TBPK    Sig: Use as directed    Dispense:  53 each    Refill:  0   atorvastatin (LIPITOR) 20 MG  tablet    Sig: Take 1 tablet (20 mg total) by mouth daily.    Dispense:  90 tablet    Refill:  3   vitamin B-12 (CYANOCOBALAMIN) 1000 MCG tablet    Sig: Take 1 tablet (1,000 mcg total) by mouth daily.   montelukast (SINGULAIR) 10 MG tablet    Sig: Take 1 tablet (10 mg total) by mouth at bedtime.    Dispense:  30 tablet    Refill:  11   Orders Placed This Encounter  Procedures   DG Cervical Spine Complete    Standing Status:   Future    Number of Occurrences:   1    Standing Expiration Date:   09/27/2022    Order Specific Question:   Reason for Exam (SYMPTOM  OR DIAGNOSIS REQUIRED)    Answer:   ongoing neck pain    Order Specific Question:   Preferred imaging location?    Answer:   [place]   Ambulatory referral to Neurology    Referral Priority:   Routine    Referral Type:   Consultation    Referral Reason:   Specialty Services Required    Requested Specialty:   Neurology    Number of Visits Requested:   1   EKG 12-Lead    Patient instructions: EKG today  Neck xrays today and we will refer you to neurologist in [place].  Chantix sent to pharmacy.  Start vitamin b12 500-1044mcg daily over the counter as your levels were low normal - this may help energy levels.  Try atorvastatin 20mg  daily for cholesterol control. Return in 6 months for follow up visit.   Follow up  plan: Return in about 6 months (around 03/28/2022) for follow up visit.  Ria Bush, MD

## 2021-09-27 ENCOUNTER — Telehealth: Payer: Self-pay

## 2021-09-27 NOTE — Telephone Encounter (Signed)
Need to relay EKG results, per Dr. Reece Agar.  EKG: Plz notify EKG returned ok.

## 2021-09-27 NOTE — Telephone Encounter (Signed)
Left message on voicemail for patient to call the office back. 

## 2021-09-27 NOTE — Addendum Note (Signed)
Addended by: Eustaquio Boyden on: 09/27/2021 08:11 AM   Modules accepted: Orders

## 2021-09-27 NOTE — Telephone Encounter (Signed)
Patient called, provided message below from Dr. Reece Agar as written

## 2021-10-03 ENCOUNTER — Other Ambulatory Visit: Payer: Self-pay | Admitting: *Deleted

## 2021-10-03 DIAGNOSIS — Z87891 Personal history of nicotine dependence: Secondary | ICD-10-CM

## 2021-10-03 DIAGNOSIS — Z122 Encounter for screening for malignant neoplasm of respiratory organs: Secondary | ICD-10-CM

## 2021-10-03 DIAGNOSIS — F17210 Nicotine dependence, cigarettes, uncomplicated: Secondary | ICD-10-CM

## 2021-10-21 ENCOUNTER — Other Ambulatory Visit: Payer: Self-pay | Admitting: Family Medicine

## 2021-10-24 ENCOUNTER — Ambulatory Visit
Admission: RE | Admit: 2021-10-24 | Discharge: 2021-10-24 | Disposition: A | Discharge status: Home / Self Care | Payer: 59 | Source: Ambulatory Visit | Attending: Acute Care | Admitting: Acute Care

## 2021-10-24 DIAGNOSIS — F17210 Nicotine dependence, cigarettes, uncomplicated: Secondary | ICD-10-CM | POA: Insufficient documentation

## 2021-10-24 DIAGNOSIS — Z87891 Personal history of nicotine dependence: Secondary | ICD-10-CM | POA: Diagnosis present

## 2021-10-24 DIAGNOSIS — Z122 Encounter for screening for malignant neoplasm of respiratory organs: Secondary | ICD-10-CM | POA: Diagnosis present

## 2021-10-26 ENCOUNTER — Telehealth: Payer: Self-pay | Admitting: Acute Care

## 2021-10-26 NOTE — Telephone Encounter (Signed)
12 month follow up  is ok on this scan Please call patient and let him know .  Average diameter has gone from 7.3 mm to 5.7 mm Equivalent Diameter has gone from 5.5 mm to 4.6 mm. So this nodule is shrinking in both measures.  Thanks so much

## 2021-10-27 ENCOUNTER — Other Ambulatory Visit: Payer: Self-pay

## 2021-10-27 DIAGNOSIS — Z87891 Personal history of nicotine dependence: Secondary | ICD-10-CM

## 2021-10-27 DIAGNOSIS — Z122 Encounter for screening for malignant neoplasm of respiratory organs: Secondary | ICD-10-CM

## 2021-10-27 DIAGNOSIS — F17210 Nicotine dependence, cigarettes, uncomplicated: Secondary | ICD-10-CM

## 2021-10-27 NOTE — Telephone Encounter (Signed)
Results sent via mychart.  Results to PCP and new order placed for annual LDCT

## 2021-11-08 ENCOUNTER — Encounter: Payer: Self-pay | Admitting: Neurology

## 2021-11-08 ENCOUNTER — Ambulatory Visit: Payer: 59 | Admitting: Neurology

## 2021-11-08 VITALS — BP 123/77 | HR 69 | Ht 72.0 in | Wt 203.0 lb

## 2021-11-08 DIAGNOSIS — G4484 Primary exertional headache: Secondary | ICD-10-CM | POA: Diagnosis not present

## 2021-11-08 DIAGNOSIS — M5481 Occipital neuralgia: Secondary | ICD-10-CM | POA: Diagnosis not present

## 2021-11-08 DIAGNOSIS — M5030 Other cervical disc degeneration, unspecified cervical region: Secondary | ICD-10-CM

## 2021-11-08 DIAGNOSIS — R51 Headache with orthostatic component, not elsewhere classified: Secondary | ICD-10-CM

## 2021-11-08 DIAGNOSIS — M542 Cervicalgia: Secondary | ICD-10-CM | POA: Insufficient documentation

## 2021-11-08 DIAGNOSIS — R519 Headache, unspecified: Secondary | ICD-10-CM

## 2021-11-08 DIAGNOSIS — G8929 Other chronic pain: Secondary | ICD-10-CM

## 2021-11-08 NOTE — Progress Notes (Addendum)
[facility]    Provider:  Dr Lucia Gaskins Requesting Provider: Eustaquio Boyden, MD Primary Care Provider:  Eustaquio Boyden, MD  CC:  headaches and neck pain  Addendum 11/20/2021 results from MRI Brain: Your MRI is stable, no changes as compared to the one in 10/24/2011. The thickened meninges(covering of the brain) are likely as a result of your history of csf leak and low-pressure headache in 2013 and since MRI findings are stable since 2013(and recent symptoms/headaches are new)  I'm not concerned unless your headaches do not go away then we should look to see if you have another csf leak. We couldn't get a good look at your cervical spine unfortunately, if you like I can order an MRI of the cervical spine since we discussed these headaches could be coming from the neck. I'll ask one my nurses to call you about this, thanks  HPI:  Gregory Johnson is a 61 y.o. male here as requested by Eustaquio Boyden, MD for headache. 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  I reviewed Dr. 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  He was started on supplementation for low vitamin  B12 with macrocytosis and fatigue.Headache worsening occipital headache with radiation to the vertex, could be consistent with cervicogenic headache with an history of cervical DDD DDD, however concerning headache worse with cough, headache in the office was consistent with an orthostatic headache.  He has exertional headache, coughing, straining, not positional like before, starts in the neck, starts in the back of the head and shoots to the top of the head. If he takes ibuprofen it can stop it from happening. Started 64-months ago, no inciting events, not during orgasm, if he is on his riding motor he can feel it if he is active, lasts 30 minutes, acute/fast/can be electric, can be dull, can be brief or last up to 30 minutes. He is taking ibuprofen every day to prevent the headache, no vision changes, popping neck helps, chronic neck pain. Daily ibuprofen (discouraged), has had some injuries during his career. No other focal neurologic deficits, associated symptoms, inciting events or modifiable factors.  Reviewed notes, labs and imaging from outside physicians, which showed   CT cervical spine 09/26/2021: FINDINGS: There is no evidence of cervical spine fracture or prevertebral soft tissue swelling. Alignment is normal. Mild degenerative endplate osteophytes are seen at C5-C6 and C6-C7. There is some neural foraminal stenosis bilaterally at C3-C4.   IMPRESSION: 1. No evidence for fracture or malalignment. 2. Mild degenerative changes  Review of Systems: Patient complains of symptoms per HPI as well as the following symptoms neck pain. Pertinent negatives and positives per HPI. All others negative.   Social History   Socioeconomic History   Marital status: Married    Spouse name:  Not on file   Number of children: 2   Years of education: some coll   Highest education level: Not on file  Occupational History   Occupation: Careers information officer: UNEMPLOYED  Tobacco Use   Smoking  status: Every Day    Packs/day: 1.00    Years: 25.00    Total pack years: 25.00    Types: Cigarettes   Smokeless tobacco: Never  Vaping Use   Vaping Use: Never used  Substance and Sexual Activity   Alcohol use: Yes    Comment: 2-3 per week   Drug use: No   Sexual activity: Not on file  Other Topics Concern   Not on file  Social History Narrative   Caffeine: 2 cups coffee, 2 pepsi daily, lots of sodaLives with wife, 2 daughters who recently graduated college, 3 dogsEdu: some collegeOccupation:  retiredFirefighterActivity: walks, treadmill, started going to gymDiet: good water, some fruits/vegetables    Social Determinants of Health   Financial Resource Strain: Not on file  Food Insecurity: Not on file  Transportation Needs: Not on file  Physical Activity: Not on file  Stress: Not on file  Social Connections: Not on file  Intimate Partner Violence: Not on file    Family History  Problem Relation Age of Onset   Diabetes Mother    Hyperlipidemia Mother    Dementia Father 64   Heart Problems Sister    Cancer Maternal Grandmother        breast cancer   Cancer Paternal Grandmother        bone cancer   Coronary artery disease Neg Hx    Stroke Neg Hx    Migraines Neg Hx     Past Medical History:  Diagnosis Date   ED (erectile dysfunction)    Orthostatic headache 10/24/2011   found to have T3/4 prominent lateral osteophytes on CT myelo with epidural contrast at levels below this, resolved after fluoro guided blood patch (Dr. Terrace Arabia [facility])   Seasonal allergies    Smoker    minimal    Patient Active Problem List   Diagnosis Date Noted   Bilateral occipital neuralgia 11/08/2021   Cervicalgia 11/08/2021   Chronic neck pain 11/08/2021   Atherosclerosis of aorta (HCC) 09/26/2021   CAD (coronary artery disease) 09/26/2021   COPD (chronic obstructive pulmonary disease) (HCC) 09/26/2021   Occipital headache 09/26/2021   DDD (degenerative disc disease), cervical 09/26/2021   Low  serum vitamin B12 09/17/2021   Right elbow pain 05/28/2019   Dyslipidemia 07/16/2017   Smoker 05/17/2016   Osteoarthritis 05/17/2016   Insomnia 12/29/2014   Encounter for general adult medical examination with abnormal findings 10/08/2012   ED (erectile dysfunction)    Seasonal allergies     Past Surgical History:  Procedure Laterality Date   APPENDECTOMY  1978   Bicep and Labrum repair  8/16   COLONOSCOPY WITH PROPOFOL N/A 07/05/2020   diverticulosis, ext hem, rpt 10 yrs ([facility])    EPIDURAL BLOOD PATCH  10/2011   spontaneous low CSF pressure H[facility]   HERNIA REPAIR  1979   left side    Current Outpatient Medications  Medication Sig Dispense Refill   atorvastatin (LIPITOR) 20 MG tablet Take 1 tablet (20 mg total) by mouth daily. 90 tablet 3   famotidine (PEPCID) 20 MG tablet Take 1 tablet (20 mg total) by mouth 2 (two) times daily as needed for heartburn or indigestion (take with ibuprofen). 90 tablet 1   fluticasone (FLONASE) 50 MCG/ACT nasal  spray PLACE 2 SPRAYS INTO BOTH NOSTRILS AT BEDTIME. 16 mL 6   ibuprofen (ADVIL) 800 MG tablet TAKE 1 TABLET BY MOUTH EVERY 8 HOURS AS NEEDED 90 tablet 0   montelukast (SINGULAIR) 10 MG tablet Take 1 tablet (10 mg total) by mouth at bedtime. 30 tablet 11   Multiple Vitamins-Minerals (CENTRUM SILVER 50+MEN) TABS Take 1 tablet by mouth daily.     sildenafil (REVATIO) 20 MG tablet TAKE 2-5 TABLETS (40-100 MG TOTAL) BY MOUTH DAILY AS NEEDED (RELATIONS). 30 tablet 3   vitamin B-12 (CYANOCOBALAMIN) 1000 MCG tablet Take 1 tablet (1,000 mcg total) by mouth daily.     Current Facility-Administered Medications  Medication Dose Route Frequency Provider Last Rate Last Admin   betamethasone acetate-betamethasone sodium phosphate (CELESTONE) injection 3 mg  3 mg Intramuscular Once Gala Lewandowsky M, DPM       betamethasone acetate-betamethasone sodium phosphate (CELESTONE) injection 3 mg  3 mg Intramuscular Once Felecia Shelling, DPM        Allergies as of  11/08/2021   (No Known Allergies)    Vitals: BP 123/77   Pulse 69   Ht 6' (1.829 m)   Wt 203 lb (92.1 kg)   BMI 27.53 kg/m  Last Weight:  Wt Readings from Last 1 Encounters:  11/08/21 203 lb (92.1 kg)   Last Height:   Ht Readings from Last 1 Encounters:  11/08/21 6' (1.829 m)     Physical exam: Exam: Gen: NAD, conversant, well nourised, obese, well groomed                     CV: RRR, no MRG. No Carotid Bruits. No peripheral edema, warm, nontender Eyes: Conjunctivae clear without exudates or hemorrhage  Neuro: Detailed Neurologic Exam  Speech:    Speech is normal; fluent and spontaneous with normal comprehension.  Cognition:    The patient is oriented to person, place, and time;     recent and remote memory intact;     language fluent;     normal attention, concentration,     fund of knowledge Cranial Nerves:    The pupils are equal, round, and reactive to light. The fundi are normal and spontaneous venous pulsations are present. Visual fields are full to finger confrontation. Extraocular movements are intact. Trigeminal sensation is intact and the muscles of mastication are normal. The face is symmetric. The palate elevates in the midline. Hearing intact. Voice is normal. Shoulder shrug is normal. The tongue has normal motion without fasciculations.   Coordination:    Normal finger to nose and heel to shin. Normal rapid alternating movements.   Gait:    Heel-toe and tandem gait are normal.   Motor Observation:    No asymmetry, no atrophy, and no involuntary movements noted. Tone:    Normal muscle tone.    Posture:    Posture is normal. normal erect    Strength:    Strength is V/V in the upper and lower limbs.      Sensation: intact to LT     Reflex Exam:  DTR's:    Deep tendon reflexes in the upper and lower extremities are normal bilaterally.   Toes:    The toes are downgoing bilaterally.   Clonus:    Clonus is absent.    Assessment/Plan: This  is a 61 year old patient with new onset exertional headaches when he coughs or Valsalva or bends over to pick something up.  He has a history of chronic neck  pain and degenerative disc disease.  When he performs exertional tasks he has radiation from his neck into the occipital area.  Due to occipital and positional and exertional headaches he needs to have an MRI of the brain to rule out space-occupying mass, Chiari malformation and other etiologies and also an MRA of the brain for aneurysms.  However I believe this could be occipital neuralgia.  I performed nerve blocks today which made patient feel tremendously better, still we have to be prudent in order MRI of the brain and MRA of the head we will see when he is here if we can image it all the way down to C3-C4 and see if he has any degenerative changes at the level of the occipital nerves (it did show that on CT of the cervical spine).  Addendum 11/20/2021 results from MRI Brain: Your MRI is stable, no changes as compared to the one in 10/24/2011. The thickened meninges(covering of the brain) are likely as a result of your history of csf leak and low-pressure headache in 2013 and since MRI findings are stable since 2013(and recent symptoms/headaches are new)  I'm not concerned unless your headaches do not go away then we should look to see if you have another csf leak. We couldn't get a good look at your cervical spine unfortunately, if you like I can order an MRI of the cervical spine since we discussed these headaches could be coming from the neck. I'll ask one my nurses to call you about this, thanks   - MRI of the brain also MRA of the brain please image all the way down to c3/c4 - he is to let me know when he gets his MRI here and I'll ask our tech to try and get the image to include to c3/c4 so he is not paying for 3 MRIS. - Occipital nerve blocks add steroids - temporary relief, worked great (bill by time not cpt code) - Injections into the spine:  [facility] - long term option, will refer ([facility] [address] here in [place].  Patient has cervical degenerative disease, cervicalgia and occipital neuralgia evaluate for injections into the neck or any other intervention as clinically warranted.) MRI Brain and MRA blood vessels - for exertional headacheas always want to make sure nothing else causing as stated above (although suspect more occipital neuralgia and from from neck/vervicalgia) -Physical therapy - stretching,manual massage, dry needling, TENS unit - he can let us know, would like ot hold off -Lidocaine patches - over the counter or topical voltaren(topical ibuprofen)  All procedures a documented blood were medically necessary, reasonable and appropriate based on the patient's history, medical diagnosis and physician opinion. Verbal informed consent was obtained from the patient, patient was informed of potential risk of procedure, including bruising, bleeding, hematoma formation, infection, muscle weakness, muscle pain, numbness, transient hypertension, transient hyperglycemia and transient insomnia among others. All areas injected were topically clean with isopropyl rubbing alcohol. Nonsterile nonlatex gloves were worn during the procedure.  1. Greater occipital nerve block [phone]). The greater occipital nerve site was identified at the nuchal line medial to the occipital artery. Medication was injected into the left and right occipital nerve areas and suboccipital areas. Patient's condition is associated with inflammation of the greater occipital nerve and associated multiple groups. Injection was deemed medically necessary, reasonable and appropriate. Injection represents a separate and unique surgical service.  2. Lesser occipital nerve block [phone]).  The lesser occipital nerve site was identified approximately 2 cm lateral to the greater occipital nerve. Occasion was  injected into the left and right occipital nerve areas. Patient's condition is associated with inflammation of the lesser occipital nerve and associated muscle groups. Injection was deemed medically necessary, reasonable and appropriate. Injection represents a separate and unique surgical service.   Orders Placed This Encounter  Procedures   MR BRAIN W WO CONTRAST   MR ANGIO HEAD WO CONTRAST   Ambulatory referral to Pain Clinic   No orders of the defined types were placed in this encounter.   Cc: Eustaquio BoydenGutierrez, Javier, MD,  Eustaquio BoydenGutierrez, Javier, MD  Naomie DeanAntonia Hillis Mcphatter, MD  [facility] [address] [place], [place] [id number]  Phone [phone] Fax [phone]  I spent 90 minutes of face-to-face and non-face-to-face time with patient on the  1. Exertional headache   2. Positional headache   3. Occipital headache   4. Worsening headaches   5. Bilateral occipital neuralgia   6. Cervicalgia   7. DDD (degenerative disc disease), cervical   8. Chronic neck pain    diagnosis.  This included previsit chart review, lab review, study review, order entry, electronic health record documentation, patient education on the different diagnostic and therapeutic options, counseling and coordination of care, risks and benefits of management, compliance, or risk factor reduction. This includes time spent on nerve blocks.

## 2021-11-08 NOTE — Progress Notes (Signed)
NERVE BLOCK WITH STEROIDS  Consent signed Methylprednisolone 80/83ml NDC [phone] LOT [id number] Exp 05/2023  Bupivacaine 0.5% 17 ml NDC 14431-540-08 LOT [id number] Exp 04/2024

## 2021-11-08 NOTE — Patient Instructions (Signed)
Occipital nerve blocks add steroids - temporarily Injections into the spine: [facility] - long term option MRI Brain and MRA blood vessels - for exertional headacheas always want to make sure nothing else causing (although suspect more occipital neuralgia and from from neck/vervicalgia) Physical therapy - stretching,manual massage, dry needling, TENS unit - he can let us know Lidocaine patches - over the counter or topical voltaren(topical ibuprofen)       Occipital Neuralgia  Occipital neuralgia is a type of headache that causes brief episodes of very bad pain in the back of the head. Pain from occipital neuralgia may spread (radiate) to other parts of the head. These headaches may be caused by irritation of the nerves that leave the spinal cord high up in the neck, just below the base of the skull (occipital nerves). The occipital nerves transmit sensations from the back of the head, the top of the head, and the areas behind the ears. What are the causes? This condition can occur without any known cause (primary headache syndrome). In other cases, this condition is caused by pressure on or irritation of one of the two occipital nerves. Pressure and irritation may be due to: Muscle spasm in the neck. Neck injury. Wear and tear of the vertebrae in the neck (osteoarthritis). Disease of the disks that separate the vertebrae. Swollen blood vessels that put pressure on the occipital nerves. Infections. Tumors. Diabetes. What are the signs or symptoms? This condition causes brief burning, stabbing, electric, shocking, or shooting pain in the back of the head that can radiate to the top of the head. It can happen on one side or both sides of the head. It can also cause: Pain behind the eye. Pain triggered by neck movement or hair brushing. Scalp tenderness. Aching in the back of the head between episodes of very bad pain. Pain that gets worse with exposure to bright  lights. How is this diagnosed? Your health care provider may diagnose the condition based on a physical exam and your symptoms. Tests may be done, such as: Imaging studies of the brain and neck (cervical spine), such as an MRI or CT scan. These look for causes of pinched nerves. Applying pressure to the nerves in the neck to try to re-create the pain. Injection of numbing medicine into the occipital nerve areas to see if pain goes away (diagnostic nerve block). How is this treated? Treatment for this condition may begin with simple measures, such as: Rest. Massage. Applying heat or cold to the area. Over-the-counter pain relievers. If these measures do not work, you may need other treatments, including: Medicines, such as: Prescription-strength anti-inflammatory medicines. Muscle relaxants. Anti-seizure medicines, which can relieve pain. Antidepressants, which can relieve pain. Injected medicines, such as medicines that numb the area (local anesthetic) and steroids. Pulsed radiofrequency ablation. This is when wires are implanted to deliver electrical impulses that block pain signals from the occipital nerve. Surgery to relieve nerve pressure. Physical therapy. Follow these instructions at home: Managing pain     Avoid any activities that cause pain. Rest when you have an attack of pain. Try gentle massage to relieve pain. Try a different pillow or sleeping position. If directed, apply heat to the affected area as often as told by your health care provider. Use the heat source that your health care provider recommends, such as a moist heat pack or a heating pad. Place a towel between your skin and the heat source. Leave the heat on  for 20-30 minutes. Remove the heat if your skin turns bright red. This is especially important if you are unable to feel pain, heat, or cold. You have a greater risk of getting burned. If directed, put ice on the back of your head and neck area. To do  this: Put ice in a plastic bag. Place a towel between your skin and the bag. Leave the ice on for 20 minutes, 2-3 times a day. Remove the ice if your skin turns bright red. This is very important. If you cannot feel pain, heat, or cold, you have a greater risk of damage to the area. General instructions Take over-the-counter and prescription medicines only as told by your health care provider. Avoid things that make your symptoms worse, such as bright lights. Try to stay active. Get regular exercise that does not cause pain. Ask your health care provider to suggest safe exercises for you. Work with a physical therapist to learn stretching exercises you can do at home. Practice good posture. Keep all follow-up visits. This is important. Contact a health care provider if: Your medicine is not working. You have new or worsening symptoms. Get help right away if: You have very bad head pain that does not go away. You have a sudden change in vision, balance, or speech. These symptoms may represent a serious problem that is an emergency. Do not wait to see if the symptoms will go away. Get medical help right away. Call your local emergency services (911 in the [place] Do not drive yourself to the hospital. Summary Occipital neuralgia is a type of headache that causes brief episodes of very bad pain in the back of the head. Pain from occipital neuralgia may spread (radiate) to other parts of the head. Treatment for this condition includes rest, massage, and medicines. This information is not intended to replace advice given to you by your health care provider. Make sure you discuss any questions you have with your health care provider. Document Revised: 02/01/2020 Document Reviewed: 02/01/2020 Elsevier Patient Education  2023 [employer].

## 2021-11-10 ENCOUNTER — Telehealth: Payer: Self-pay | Admitting: Neurology

## 2021-11-10 NOTE — Telephone Encounter (Signed)
Referral for Pain Clinic sent to [facility] [phone].

## 2021-11-10 NOTE — Telephone Encounter (Signed)
75 mins MRI brain w/wo & MRA head wo Dr. Lucia Gaskins [facility] NPR case #[id number]   He is scheduled at [facility] 11/16/21 at 8am

## 2021-11-14 NOTE — Telephone Encounter (Signed)
Patient is scheduled 11/29/21

## 2021-11-16 ENCOUNTER — Ambulatory Visit: Payer: 59

## 2021-11-16 DIAGNOSIS — R51 Headache with orthostatic component, not elsewhere classified: Secondary | ICD-10-CM | POA: Diagnosis not present

## 2021-11-16 DIAGNOSIS — R519 Headache, unspecified: Secondary | ICD-10-CM | POA: Diagnosis not present

## 2021-11-16 DIAGNOSIS — G4484 Primary exertional headache: Secondary | ICD-10-CM | POA: Diagnosis not present

## 2021-11-16 MED ORDER — GADOBENATE DIMEGLUMINE 529 MG/ML IV SOLN
20.0000 mL | Freq: Once | INTRAVENOUS | Status: AC | PRN
  Administered 2021-11-16: 20 mL via INTRAVENOUS

## 2021-11-20 NOTE — Progress Notes (Signed)
Please call and discuss Your MRI is stable, no changes as compared to the one in 10/24/2011. The thickened meninges(covering of the brain) are likely as a result of your history of csf leak and low-pressure headache in 2013 and since MRI findings are stable since 2013(and recent symptoms/headaches are new)  I'm not concerned unless your headaches do not go away then we should look to see if you have another csf leak. We couldn't get a good look at your cervical spine unfortunately, if you like I can order an MRI of the cervical spine since we discussed these headaches could be coming from the neck. I'll ask one my nurses to call you about this, thanks

## 2021-11-22 ENCOUNTER — Telehealth: Payer: Self-pay | Admitting: *Deleted

## 2021-11-22 NOTE — Telephone Encounter (Signed)
-----   Message from Anson Fret, MD sent at 11/20/2021  8:44 PM EDT ----- Please call and discuss Your MRI is stable, no changes as compared to the one in 10/24/2011. The thickened meninges(covering of the brain) are likely as a result of your history of csf leak and low-pressure headache in 2013 and since MRI findings are stable since 2013(and recent symptoms/headaches are new)  I'm not concerned unless your headaches do not go away then we should look to see if you have another csf leak. We couldn't get a good look at your cervical spine unfortunately, if you like I can order an MRI of the cervical spine since we discussed these headaches could be coming from the neck. I'll ask one my nurses to call you about this, thanks

## 2021-11-22 NOTE — Telephone Encounter (Signed)
I called the pt and LVM with details (Ok per [facility]) advising patient of the results of his MRI brain as noted below by Dr. Lucia Gaskins.  I advised the patient that we can order an MRI of his cervical spine if he would like.  Also left office number for him to call back if he has any questions.  He can also respond to MyChart message that I will send.

## 2021-12-29 ENCOUNTER — Telehealth: Payer: Self-pay

## 2021-12-29 NOTE — Telephone Encounter (Signed)
[facility] has faxed over Progress Notes. Placed on MD desk for review.

## 2022-02-06 ENCOUNTER — Other Ambulatory Visit: Payer: Self-pay | Admitting: Family Medicine

## 2022-02-06 DIAGNOSIS — N529 Male erectile dysfunction, unspecified: Secondary | ICD-10-CM

## 2022-03-09 ENCOUNTER — Other Ambulatory Visit: Payer: Self-pay | Admitting: Family Medicine

## 2022-03-13 NOTE — Telephone Encounter (Signed)
Last filled on 07/20/21 #90 with 0 refill

## 2022-03-15 ENCOUNTER — Ambulatory Visit: Payer: 59 | Admitting: Neurology

## 2022-03-28 ENCOUNTER — Ambulatory Visit: Payer: 59 | Admitting: Family Medicine

## 2022-04-23 ENCOUNTER — Other Ambulatory Visit: Payer: Self-pay | Admitting: Family Medicine

## 2022-04-24 NOTE — Telephone Encounter (Signed)
Ibuprofen Last filled: 03/14/22, #90 Last OV: 09/26/21, CPE Next OV: 09/29/22, CPE

## 2022-04-25 NOTE — Telephone Encounter (Signed)
This was last filled 03/14/22. Is he needing more frequently? Why?

## 2022-04-26 NOTE — Telephone Encounter (Signed)
Spoke to patient by telephone and was advised that he did not request the refill. Patient stated that he is fine and does not need them now. Patient stated that [facility] has asked [facility] to [facility] off of automatic refill but undoubtedly they have not. Patient stated that he will ask [facility] to take this off of auto refill.

## 2022-06-15 ENCOUNTER — Other Ambulatory Visit: Payer: Self-pay | Admitting: Family Medicine

## 2022-06-15 DIAGNOSIS — N529 Male erectile dysfunction, unspecified: Secondary | ICD-10-CM

## 2022-06-16 ENCOUNTER — Telehealth: Payer: Self-pay

## 2022-06-16 NOTE — Telephone Encounter (Signed)
Plz submit PA for sildenafil 20 mg .

## 2022-06-16 NOTE — Telephone Encounter (Signed)
Sent request for PA to Rx PA Team. (See 06/16/22 phn note.)

## 2022-06-19 ENCOUNTER — Other Ambulatory Visit (HOSPITAL_COMMUNITY): Payer: Self-pay

## 2022-06-19 NOTE — Telephone Encounter (Signed)
Pharmacy Patient Advocate Encounter   Received notification that prior authorization for Sildenafil Citrate '20MG'$  tablets is required/requested.  Per Test Claim: Plan limitations exceeded - Maximum Daily Dose of 0.5   PA submitted on 06/19/22 to (ins) [employer] via CoverMyMeds Key or (Medicaid) confirmation # [id number] Status is pending

## 2022-06-20 ENCOUNTER — Other Ambulatory Visit: Payer: Self-pay | Admitting: Family Medicine

## 2022-06-20 NOTE — Telephone Encounter (Signed)
Ibuprofen Last rx: 03/14/22, #90 Last OV:  09/26/21, CPE Next OV:  09/29/22, CPE

## 2022-06-20 NOTE — Telephone Encounter (Signed)
Prescription Request  06/20/2022  LOV: 09/26/2021  What is the name of the medication or equipment? ibuprofen (ADVIL) 800 MG tablet   Have you contacted your pharmacy to request a refill? Yes   Which pharmacy would you like this sent to?  [id number] [place] [facility] [address] [place] [address]Phone: [phone]: [phone]   Patient notified that their request is being sent to the clinical staff for review and that they should receive a response within 2 business days.   Please advise at Mobile [phone](mobile)

## 2022-06-20 NOTE — Addendum Note (Signed)
Addended by: Brenton Grills on: [id number] PM   Modules accepted: Orders

## 2022-06-21 MED ORDER — IBUPROFEN 800 MG PO TABS
800.0000 mg | ORAL_TABLET | Freq: Three times a day (TID) | ORAL | 0 refills | Status: DC | PRN
Start: 2022-06-21 — End: 2022-09-15

## 2022-06-26 NOTE — Telephone Encounter (Signed)
Checking on status of PA

## 2022-07-05 NOTE — Telephone Encounter (Signed)
Do we have an update on PA?

## 2022-07-07 NOTE — Telephone Encounter (Signed)
Received a notification regarding Prior Authorization from [facility] for Sildenafil 20mg  tabs.   Authorization has been DENIED because you do not meet the established medication-specific criteria or guidelines for sildenafil at this time. More than 15 per month is covered only if: you are an adult patient with pulmonary arterial hypertension.  Phone# [phone]

## 2022-07-07 NOTE — Telephone Encounter (Signed)
Noted.   Spoke with pt notifying him of denial. Recommended checking with a [facility] or [facility] due to a cheaper option for paying out of pocket. Pt verbalizes understanding.

## 2022-07-07 NOTE — Telephone Encounter (Signed)
Pt's ins co denied PA. Pt aware of denial and that he will have to pay out of pocket.  Request denied.

## 2022-07-22 ENCOUNTER — Other Ambulatory Visit: Payer: Self-pay | Admitting: Family Medicine

## 2022-07-22 DIAGNOSIS — N529 Male erectile dysfunction, unspecified: Secondary | ICD-10-CM

## 2022-07-24 NOTE — Telephone Encounter (Signed)
Refill request Sildenafil Last refill 02/06/22 #30/3 Last office visit 09/26/21 Upcoming appointment 09/29/22

## 2022-09-14 ENCOUNTER — Other Ambulatory Visit: Payer: Self-pay | Admitting: Family Medicine

## 2022-09-14 DIAGNOSIS — M47812 Spondylosis without myelopathy or radiculopathy, cervical region: Secondary | ICD-10-CM

## 2022-09-14 NOTE — Telephone Encounter (Signed)
Ibuprofen Last filled:  06/26/22, #90 Last OV:  09/26/21, CPE Next OV:  09/29/22, CPE

## 2022-09-21 ENCOUNTER — Other Ambulatory Visit: Payer: Self-pay | Admitting: Family Medicine

## 2022-09-21 DIAGNOSIS — E785 Hyperlipidemia, unspecified: Secondary | ICD-10-CM

## 2022-09-21 DIAGNOSIS — E538 Deficiency of other specified B group vitamins: Secondary | ICD-10-CM

## 2022-09-21 DIAGNOSIS — Z125 Encounter for screening for malignant neoplasm of prostate: Secondary | ICD-10-CM

## 2022-09-22 ENCOUNTER — Other Ambulatory Visit (INDEPENDENT_AMBULATORY_CARE_PROVIDER_SITE_OTHER): Payer: 59

## 2022-09-22 DIAGNOSIS — Z125 Encounter for screening for malignant neoplasm of prostate: Secondary | ICD-10-CM | POA: Diagnosis not present

## 2022-09-22 DIAGNOSIS — E785 Hyperlipidemia, unspecified: Secondary | ICD-10-CM | POA: Diagnosis not present

## 2022-09-22 DIAGNOSIS — E538 Deficiency of other specified B group vitamins: Secondary | ICD-10-CM

## 2022-09-22 LAB — COMPREHENSIVE METABOLIC PANEL
ALT: 26 U/L (ref 0–53)
AST: 22 U/L (ref 0–37)
Albumin: 3.9 g/dL (ref 3.5–5.2)
Alkaline Phosphatase: 52 U/L (ref 39–117)
BUN: 13 mg/dL (ref 6–23)
CO2: 28 mEq/L (ref 19–32)
Calcium: 8.8 mg/dL (ref 8.4–10.5)
Chloride: 107 mEq/L (ref 96–112)
Creatinine, Ser: 1.09 mg/dL (ref 0.40–1.50)
GFR: 73.04 mL/min (ref >60.00)
Glucose, Bld: 99 mg/dL (ref 70–99)
Potassium: 4.2 mEq/L (ref 3.5–5.1)
Sodium: 141 mEq/L (ref 135–145)
Total Bilirubin: 0.7 mg/dL (ref 0.2–1.2)
Total Protein: 6 g/dL (ref 6.0–8.3)

## 2022-09-22 LAB — LIPID PANEL
Cholesterol: 137 mg/dL (ref 0–200)
HDL: 30 mg/dL — ABNORMAL LOW (ref >39.00)
LDL Cholesterol: 79 mg/dL (ref 0–99)
NonHDL: 107.1
Total CHOL/HDL Ratio: 5
Triglycerides: 140 mg/dL (ref 0.0–149.0)
VLDL: 28 mg/dL (ref 0.0–40.0)

## 2022-09-22 LAB — PSA: PSA: 1.12 ng/mL (ref 0.10–4.00)

## 2022-09-22 LAB — VITAMIN B12: Vitamin B-12: 324 pg/mL (ref 211–911)

## 2022-09-29 ENCOUNTER — Encounter: Payer: Self-pay | Admitting: Family Medicine

## 2022-09-29 ENCOUNTER — Ambulatory Visit (INDEPENDENT_AMBULATORY_CARE_PROVIDER_SITE_OTHER): Payer: 59 | Admitting: Family Medicine

## 2022-09-29 VITALS — BP 132/78 | HR 74 | Temp 97.8°F | Ht 71.25 in | Wt 200.4 lb

## 2022-09-29 DIAGNOSIS — N529 Male erectile dysfunction, unspecified: Secondary | ICD-10-CM | POA: Diagnosis not present

## 2022-09-29 DIAGNOSIS — M47812 Spondylosis without myelopathy or radiculopathy, cervical region: Secondary | ICD-10-CM

## 2022-09-29 DIAGNOSIS — Z0000 Encounter for general adult medical examination without abnormal findings: Secondary | ICD-10-CM | POA: Diagnosis not present

## 2022-09-29 DIAGNOSIS — M5030 Other cervical disc degeneration, unspecified cervical region: Secondary | ICD-10-CM

## 2022-09-29 DIAGNOSIS — I700 Atherosclerosis of aorta: Secondary | ICD-10-CM

## 2022-09-29 DIAGNOSIS — F17200 Nicotine dependence, unspecified, uncomplicated: Secondary | ICD-10-CM

## 2022-09-29 DIAGNOSIS — I2510 Atherosclerotic heart disease of native coronary artery without angina pectoris: Secondary | ICD-10-CM

## 2022-09-29 DIAGNOSIS — J302 Other seasonal allergic rhinitis: Secondary | ICD-10-CM

## 2022-09-29 DIAGNOSIS — E785 Hyperlipidemia, unspecified: Secondary | ICD-10-CM

## 2022-09-29 DIAGNOSIS — J449 Chronic obstructive pulmonary disease, unspecified: Secondary | ICD-10-CM

## 2022-09-29 DIAGNOSIS — R519 Headache, unspecified: Secondary | ICD-10-CM

## 2022-09-29 DIAGNOSIS — E538 Deficiency of other specified B group vitamins: Secondary | ICD-10-CM

## 2022-09-29 MED ORDER — SILDENAFIL CITRATE 20 MG PO TABS
40.0000 mg | ORAL_TABLET | Freq: Every day | ORAL | 4 refills | Status: DC | PRN
Start: 2022-09-29 — End: 2023-04-23

## 2022-09-29 MED ORDER — IPRATROPIUM BROMIDE 0.03 % NA SOLN: 2.0000 | Freq: Two times a day (BID) | NASAL | 0 refills | Status: DC

## 2022-09-29 MED ORDER — VARENICLINE TARTRATE (STARTER) 0.5 MG X 11 & 1 MG X 42 PO TBPK: ORAL_TABLET | ORAL | 0 refills | Status: DC

## 2022-09-29 MED ORDER — IBUPROFEN 800 MG PO TABS: 800.0000 mg | ORAL_TABLET | Freq: Three times a day (TID) | ORAL | 0 refills | Status: DC | PRN

## 2022-09-29 MED ORDER — FLUTICASONE PROPIONATE 50 MCG/ACT NA SUSP
2.0000 | Freq: Every day | NASAL | 6 refills | Status: AC
Start: 2022-09-29 — End: ?

## 2022-09-29 MED ORDER — VARENICLINE TARTRATE 1 MG PO TABS: 1.0000 mg | ORAL_TABLET | Freq: Two times a day (BID) | ORAL | 1 refills | Status: DC

## 2022-09-29 MED ORDER — LORATADINE 10 MG PO TABS: 10.0000 mg | ORAL_TABLET | Freq: Every day | ORAL | Status: DC

## 2022-09-29 MED ORDER — ATORVASTATIN CALCIUM 20 MG PO TABS: 20.0000 mg | ORAL_TABLET | Freq: Every day | ORAL | 4 refills | Status: DC

## 2022-09-29 NOTE — Assessment & Plan Note (Signed)
Preventative protocols reviewed and updated unless pt declined. Discussed healthy diet and lifestyle.  

## 2022-09-29 NOTE — Patient Instructions (Signed)
I think runny nose is allergy related. Use nasal saline throughout the day. After working outside, take a shower. Take claritin daily or similar antihistamine (xyzal, allegra, zyrtec), may try benadryl at night time as needed. May try ipratropium nasal spray.  Let us know if not better with this  We will aim to check heart CT with calcium score towards end of the year.  Get your lung cancer screening CT next month.  Good to see you today.  Return as needed or in 1 year for next physical.

## 2022-09-29 NOTE — Progress Notes (Unsigned)
Ph: [phone] Fax: [phone]   Patient ID: Gregory Johnson, male    DOB: 1960/07/04, 62 y.o.   MRN: [id number]  This visit was conducted in person.  BP 132/78   Pulse 74   Temp 97.8 F (36.6 C) (Temporal)   Ht 5' 11.25" (1.81 m)   Wt 200 lb 6 oz (90.9 kg)   SpO2 95%   BMI 27.75 kg/m    CC: CPE Subjective:   HPI: Gregory Johnson is a 62 y.o. male presenting on 09/29/2022 for Annual Exam (Wants to discus montelukast. 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   CT chest lung cancer screening 10/2021  - CAD (LAD) as well as aortic ATH, mild centrilobular and paraseptal emphysema. Maternal grandfather with MI. Interested in coronary CT with calcium score end of 2024.  HA with cough - saw Dr Lucia Gaskins with reassuring evaluation (brain MRI/MRA). Received nerve blocks with significant improvement - likely occipital neuralgia related headache. [facility] for further evaluation of cervical DDD, cervicalgia, occipital neuralgia. Had cervical ablation with some improvement. Continues taking ibuprofen 400mg  once daily. Discussing surgery.   Allergic rhinitis - notes ongoing drainage worse when laying supine.   Preventative: Colonoscopy 06/2020 - diverticulosis, ext hem, rpt 10 yrs ([place])  Prostate screening - yearly screen. No prostate symptoms.  Lung cancer screening - started 08/2020, latest 10/2021  Flu - declines  COVID vaccine Pfizer x2, no booster Tdap 2012, Td 2016  Shingrix - discussed, declines  Seat belt use discussed  Sunscreen use discussed. No changing moles. Has had cancerous spots removed from skin [name]).  Sleep - averaging 8 hours/night Smoking - 1/2ppd. Quit 2020, restarted 2021. >20 PY hx. wants to quit by 10/2022.  Alcohol - 3 drinks/wk Dentist q6 mo  Eye exam yearly, no glaucoma history Bowel - no constipation    Caffeine: 2 cups coffee, 1 coke daily, lots of soda   Lives with wife, 2 daughters who  recently graduated college, 3 dogs   Edu: some college   Occupation: IT sales professional, retired  Activity: walks, treadmill, gym  Diet: good water, daily fruits/vegetables      Relevant past medical, surgical, family and social history reviewed and updated as indicated. Interim medical history since our last visit reviewed. Allergies and medications reviewed and updated. Outpatient Medications Prior to Visit  Medication Sig Dispense Refill   famotidine (PEPCID) 20 MG tablet Take 1 tablet (20 mg total) by mouth 2 (two) times daily as needed for heartburn or indigestion (take with ibuprofen). 90 tablet 1   Multiple Vitamins-Minerals (CENTRUM SILVER 50+MEN) TABS Take 1 tablet by mouth daily.     atorvastatin (LIPITOR) 20 MG tablet Take 1 tablet (20 mg total) by mouth daily. 90 tablet 3   fluticasone (FLONASE) 50 MCG/ACT nasal spray PLACE 2 SPRAYS INTO BOTH NOSTRILS AT BEDTIME. 16 mL 6   ibuprofen (ADVIL) 800 MG tablet TAKE 1 TABLET BY MOUTH EVERY 8 HOURS AS NEEDED 90 tablet 0   sildenafil (REVATIO) 20 MG tablet TAKE 2-5 TABLETS (40-100 MG TOTAL) BY MOUTH DAILY AS NEEDED (RELATIONS). 45 tablet 3   montelukast (SINGULAIR) 10 MG tablet Take 1 tablet (10 mg total) by mouth at bedtime. (Patient not taking: Reported on 09/29/2022) 30 tablet 11   vitamin B-12 (CYANOCOBALAMIN) 1000 MCG tablet Take 1 tablet (1,000 mcg total) by mouth daily.     Facility-Administered Medications Prior to Visit  Medication Dose Route Frequency  Provider Last Rate Last Admin   betamethasone acetate-betamethasone sodium phosphate (CELESTONE) injection 3 mg  3 mg Intramuscular Once Gala Lewandowsky M, DPM       betamethasone acetate-betamethasone sodium phosphate (CELESTONE) injection 3 mg  3 mg Intramuscular Once Felecia Shelling, DPM         Per HPI unless specifically indicated in ROS section below Review of Systems  Constitutional:  Negative for activity change, appetite change, chills, fatigue, fever and unexpected weight change.   HENT:  Positive for postnasal drip and rhinorrhea. Negative for hearing loss.   Eyes:  Negative for visual disturbance.  Respiratory:  Positive for cough. Negative for chest tightness, shortness of breath and wheezing.   Cardiovascular:  Negative for chest pain, palpitations and leg swelling.  Gastrointestinal:  Negative for abdominal distention, abdominal pain, blood in stool, constipation, diarrhea, nausea and vomiting.  Genitourinary:  Negative for difficulty urinating and hematuria.  Musculoskeletal:  Negative for arthralgias, myalgias and neck pain.  Skin:  Negative for rash.  Neurological:  Negative for dizziness, seizures, syncope and headaches.  Hematological:  Negative for adenopathy. Does not bruise/bleed easily.  Psychiatric/Behavioral:  Negative for dysphoric mood. The patient is not nervous/anxious.     Objective:  BP 132/78   Pulse 74   Temp 97.8 F (36.6 C) (Temporal)   Ht 5' 11.25" (1.81 m)   Wt 200 lb 6 oz (90.9 kg)   SpO2 95%   BMI 27.75 kg/m   Wt Readings from Last 3 Encounters:  09/29/22 200 lb 6 oz (90.9 kg)  11/08/21 203 lb (92.1 kg)  09/26/21 200 lb 8 oz (90.9 kg)      Physical Exam Vitals and nursing note reviewed.  Constitutional:      General: He is not in acute distress.    Appearance: Normal appearance. He is well-developed. He is not ill-appearing.  HENT:     Head: Normocephalic and atraumatic.     Right Ear: Hearing, tympanic membrane, ear canal and external ear normal.     Left Ear: Hearing, tympanic membrane, ear canal and external ear normal.     Nose: Rhinorrhea present. No mucosal edema or congestion.     Right Turbinates: Pale. Not enlarged or swollen.     Left Turbinates: Pale. Not enlarged or swollen.     Mouth/Throat:     Mouth: Mucous membranes are moist.     Pharynx: Oropharynx is clear. No oropharyngeal exudate or posterior oropharyngeal erythema.  Eyes:     General: No scleral icterus.    Extraocular Movements: Extraocular  movements intact.     Conjunctiva/sclera: Conjunctivae normal.     Pupils: Pupils are equal, round, and reactive to light.  Neck:     Thyroid: No thyroid mass or thyromegaly.  Cardiovascular:     Rate and Rhythm: Normal rate and regular rhythm.     Pulses: Normal pulses.          Radial pulses are 2+ on the right side and 2+ on the left side.     Heart sounds: Normal heart sounds. No murmur heard. Pulmonary:     Effort: Pulmonary effort is normal. No respiratory distress.     Breath sounds: Normal breath sounds. No wheezing, rhonchi or rales.  Abdominal:     General: Bowel sounds are normal. There is no distension.     Palpations: Abdomen is soft. There is no mass.     Tenderness: There is no abdominal tenderness. There is no guarding or rebound.  Hernia: No hernia is present.  Musculoskeletal:        General: Normal range of motion.     Cervical back: Normal range of motion and neck supple.     Right lower leg: No edema.     Left lower leg: No edema.  Lymphadenopathy:     Cervical: No cervical adenopathy.  Skin:    General: Skin is warm and dry.     Findings: No rash.  Neurological:     General: No focal deficit present.     Mental Status: He is alert and oriented to person, place, and time.  Psychiatric:        Mood and Affect: Mood normal.        Behavior: Behavior normal.        Thought Content: Thought content normal.        Judgment: Judgment normal.       Results for orders placed or performed in visit on 09/22/22  PSA  Result Value Ref Range   PSA 1.12 0.10 - 4.00 ng/mL  Vitamin B12  Result Value Ref Range   Vitamin B-12 324 211 - 911 pg/mL  Comprehensive metabolic panel  Result Value Ref Range   Sodium 141 135 - 145 mEq/L   Potassium 4.2 3.5 - 5.1 mEq/L   Chloride 107 96 - 112 mEq/L   CO2 28 19 - 32 mEq/L   Glucose, Bld 99 70 - 99 mg/dL   BUN 13 6 - 23 mg/dL   Creatinine, Ser 1.61 0.40 - 1.50 mg/dL   Total Bilirubin 0.7 0.2 - 1.2 mg/dL   Alkaline  Phosphatase 52 39 - 117 U/L   AST 22 0 - 37 U/L   ALT 26 0 - 53 U/L   Total Protein 6.0 6.0 - 8.3 g/dL   Albumin 3.9 3.5 - 5.2 g/dL   GFR 09.60 >45.40 mL/min   Calcium 8.8 8.4 - 10.5 mg/dL  Lipid panel  Result Value Ref Range   Cholesterol 137 0 - 200 mg/dL   Triglycerides 981.1 0.0 - 149.0 mg/dL   HDL 91.47 (L) >82.95 mg/dL   VLDL 62.1 0.0 - 30.8 mg/dL   LDL Cholesterol 79 0 - 99 mg/dL   Total CHOL/HDL Ratio 5    NonHDL 107.10     Assessment & Plan:   Problem List Items Addressed This Visit     Health maintenance examination - Primary (Chronic)    Preventative protocols reviewed and updated unless pt declined. Discussed healthy diet and lifestyle.       ED (erectile dysfunction)    Generic slidenafil refilled      Relevant Medications   sildenafil (REVATIO) 20 MG tablet   Seasonal allergies    Predominant clear rhinorrhea worse when supine. Anticipate allergic - reviewed allergen avoidance measures. Will recommend try antihistamine followed by atrovent nasal spray PRN      Smoker    Continued smoker.  Continues lung cancer screening CT.  Contemplative, interested in retrial of chantix - will send starting and continuing packs to pharmacy.        Osteoarthritis   Relevant Medications   ibuprofen (ADVIL) 800 MG tablet   Dyslipidemia    Chronic, stable. Continue atorvastatin 20mg  daily. The 10-year ASCVD risk score (Arnett DK, et al., 2019) is: 15.5%   Values used to calculate the score:     Age: 89 years     Sex: Male     Is Non-Hispanic African American: No  Diabetic: No     Tobacco smoker: Yes     Systolic Blood Pressure: 132 mmHg     Is BP treated: No     HDL Cholesterol: 30 mg/dL     Total Cholesterol: 137 mg/dL       Relevant Medications   atorvastatin (LIPITOR) 20 MG tablet   Low serum vitamin B12    He stopped B12 - recent level trending dow - consider restarting if running elevated.       Atherosclerosis of aorta (HCC)    Continue  aspirin.       Relevant Medications   atorvastatin (LIPITOR) 20 MG tablet   sildenafil (REVATIO) 20 MG tablet   CAD (coronary artery disease)    Incidentally noted on lung cancer screening CT - CAD to LAD with aortic ATH.  Discussed further evaluation with coronary calcium score - will order at end of 2024 per pt request.       Relevant Medications   atorvastatin (LIPITOR) 20 MG tablet   sildenafil (REVATIO) 20 MG tablet   COPD (chronic obstructive pulmonary disease) (HCC)    Incidental on imaging.  Without respiratory symptoms.       Relevant Medications   fluticasone (FLONASE) 50 MCG/ACT nasal spray   loratadine (CLARITIN) 10 MG tablet   ipratropium (ATROVENT) 0.03 % nasal spray   varenicline (CHANTIX CONTINUING MONTH PAK) 1 MG tablet   Varenicline Tartrate, Starter, (CHANTIX STARTING MONTH PAK) 0.5 MG X 11 & 1 MG X 42 TBPK   Occipital headache    Significantly improved with occipital nerve injection Lucia Gaskins), appreciate neurology care. Continues ibuprofen 400mg  daily.       Relevant Medications   ibuprofen (ADVIL) 800 MG tablet   DDD (degenerative disc disease), cervical    Referred to [facility] clinic for further evaluation - pending trial cervical ablation.       Relevant Medications   ibuprofen (ADVIL) 800 MG tablet     Meds ordered this encounter  Medications   atorvastatin (LIPITOR) 20 MG tablet    Sig: Take 1 tablet (20 mg total) by mouth daily.    Dispense:  90 tablet    Refill:  4   fluticasone (FLONASE) 50 MCG/ACT nasal spray    Sig: Place 2 sprays into both nostrils at bedtime.    Dispense:  16 mL    Refill:  6   ibuprofen (ADVIL) 800 MG tablet    Sig: Take 1 tablet (800 mg total) by mouth every 8 (eight) hours as needed.    Dispense:  90 tablet    Refill:  0   sildenafil (REVATIO) 20 MG tablet    Sig: Take 2-3 tablets (40-60 mg total) by mouth daily as needed (relations).    Dispense:  45 tablet    Refill:  4   loratadine (CLARITIN) 10  MG tablet    Sig: Take 1 tablet (10 mg total) by mouth daily.   ipratropium (ATROVENT) 0.03 % nasal spray    Sig: Place 2 sprays into both nostrils every 12 (twelve) hours for 3 days.    Dispense:  30 mL    Refill:  0   varenicline (CHANTIX CONTINUING MONTH PAK) 1 MG tablet    Sig: Take 1 tablet (1 mg total) by mouth 2 (two) times daily.    Dispense:  60 tablet    Refill:  1   Varenicline Tartrate, Starter, (CHANTIX STARTING MONTH PAK) 0.5 MG X 11 & 1 MG X  42 TBPK    Sig: Use as directed    Dispense:  53 each    Refill:  0    No orders of the defined types were placed in this encounter.   Patient Instructions  I think runny nose is allergy related. Use nasal saline throughout the day. After working outside, take a shower. Take claritin daily or similar antihistamine (xyzal, allegra, zyrtec), may try benadryl at night time as needed. May try ipratropium nasal spray.  Let us know if not better with this  We will aim to check heart CT with calcium score towards end of the year.  Get your lung cancer screening CT next month.  Good to see you today.  Return as needed or in 1 year for next physical.   Follow up plan: Return in about 1 year (around 09/29/2023) for annual exam, prior fasting for blood work.  Eustaquio Boyden, MD

## 2022-10-02 NOTE — Assessment & Plan Note (Signed)
Chronic, stable. Continue atorvastatin 20mg  daily. The 10-year ASCVD risk score (Arnett DK, et al., 2019) is: 15.5%   Values used to calculate the score:     Age: 62 years     Sex: Male     Is Non-Hispanic African American: No     Diabetic: No     Tobacco smoker: Yes     Systolic Blood Pressure: 132 mmHg     Is BP treated: No     HDL Cholesterol: 30 mg/dL     Total Cholesterol: 137 mg/dL

## 2022-10-02 NOTE — Assessment & Plan Note (Addendum)
Significantly improved with occipital nerve injection Gregory Johnson), appreciate neurology care. Continues ibuprofen 400mg  daily.

## 2022-10-02 NOTE — Assessment & Plan Note (Signed)
Continue aspirin 

## 2022-10-02 NOTE — Assessment & Plan Note (Signed)
He stopped B12 - recent level trending dow - consider restarting if running elevated.

## 2022-10-02 NOTE — Assessment & Plan Note (Signed)
Incidentally noted on lung cancer screening CT - CAD to LAD with aortic ATH.  Discussed further evaluation with coronary calcium score - will order at end of 2024 per pt request.

## 2022-10-02 NOTE — Assessment & Plan Note (Addendum)
Predominant clear rhinorrhea worse when supine. Anticipate allergic - reviewed allergen avoidance measures. Will recommend try antihistamine followed by atrovent nasal spray PRN

## 2022-10-02 NOTE — Assessment & Plan Note (Signed)
Generic slidenafil refilled

## 2022-10-02 NOTE — Assessment & Plan Note (Signed)
Incidental on imaging.  Without respiratory symptoms.

## 2022-10-02 NOTE — Assessment & Plan Note (Signed)
Referred to [facility] clinic for further evaluation - pending trial cervical ablation.

## 2022-10-02 NOTE — Assessment & Plan Note (Signed)
Continued smoker.  Continues lung cancer screening CT.  Contemplative, interested in retrial of chantix - will send starting and continuing packs to pharmacy.

## 2022-10-12 ENCOUNTER — Other Ambulatory Visit: Payer: Self-pay | Admitting: Family Medicine

## 2022-10-12 DIAGNOSIS — E785 Hyperlipidemia, unspecified: Secondary | ICD-10-CM

## 2022-10-12 NOTE — Telephone Encounter (Signed)
Too soon. Rx sent on 09/29/22, #90/4 to [facility].   Request denied.

## 2022-10-24 ENCOUNTER — Other Ambulatory Visit: Payer: Self-pay | Admitting: Family Medicine

## 2022-10-24 DIAGNOSIS — F17200 Nicotine dependence, unspecified, uncomplicated: Secondary | ICD-10-CM

## 2022-10-24 NOTE — Telephone Encounter (Signed)
Message from pharmacy:  REQUEST FOR 90 DAYS PRESCRIPTION.   Chantix continuing mth pk 1 mg Last filled:  09/29/22, #60 Last OV:  09/29/22, CPE Next OV:  10/02/23, CPE

## 2022-10-26 ENCOUNTER — Ambulatory Visit
Admission: RE | Admit: 2022-10-26 | Discharge: 2022-10-26 | Disposition: A | Discharge status: Home / Self Care | Payer: 59 | Source: Ambulatory Visit | Attending: Family Medicine | Admitting: Family Medicine

## 2022-10-26 DIAGNOSIS — Z122 Encounter for screening for malignant neoplasm of respiratory organs: Secondary | ICD-10-CM | POA: Insufficient documentation

## 2022-10-26 DIAGNOSIS — F17210 Nicotine dependence, cigarettes, uncomplicated: Secondary | ICD-10-CM | POA: Insufficient documentation

## 2022-10-26 DIAGNOSIS — Z87891 Personal history of nicotine dependence: Secondary | ICD-10-CM | POA: Diagnosis present

## 2022-10-26 NOTE — Telephone Encounter (Signed)
Chantix shouldn't be used as a long term medication. Should take for 2-3 months max then stop.  I have refilled 1 more 1 month supply of continuing dose.  If ongoing need, pt needs to contact office for update.

## 2022-10-27 NOTE — Telephone Encounter (Signed)
Lvm asking pt to call back.  Need to relay Dr. G's message.  

## 2022-10-30 ENCOUNTER — Other Ambulatory Visit: Payer: Self-pay | Admitting: Acute Care

## 2022-10-30 DIAGNOSIS — Z122 Encounter for screening for malignant neoplasm of respiratory organs: Secondary | ICD-10-CM

## 2022-10-30 DIAGNOSIS — F17210 Nicotine dependence, cigarettes, uncomplicated: Secondary | ICD-10-CM

## 2022-10-30 DIAGNOSIS — Z87891 Personal history of nicotine dependence: Secondary | ICD-10-CM

## 2022-10-30 NOTE — Telephone Encounter (Signed)
 Spoke with pt relaying Dr. G's message. Pt verbalizes understanding.  

## 2022-11-10 ENCOUNTER — Other Ambulatory Visit: Payer: Self-pay | Admitting: Family Medicine

## 2022-11-10 DIAGNOSIS — F17200 Nicotine dependence, unspecified, uncomplicated: Secondary | ICD-10-CM

## 2022-11-10 NOTE — Telephone Encounter (Signed)
Per Dr. Reece Agar, rx is not meant for long term use. Needs OV if requests another refill. (See 10/24/22 refill note.)  Request denied

## 2023-02-21 ENCOUNTER — Other Ambulatory Visit: Payer: Self-pay | Admitting: Family Medicine

## 2023-02-21 DIAGNOSIS — M47812 Spondylosis without myelopathy or radiculopathy, cervical region: Secondary | ICD-10-CM

## 2023-04-14 ENCOUNTER — Telehealth: Payer: Self-pay | Admitting: Family Medicine

## 2023-04-14 DIAGNOSIS — I2510 Atherosclerotic heart disease of native coronary artery without angina pectoris: Secondary | ICD-10-CM

## 2023-04-14 DIAGNOSIS — E785 Hyperlipidemia, unspecified: Secondary | ICD-10-CM

## 2023-04-14 NOTE — Telephone Encounter (Signed)
Plz notify - we had discussed checking coronary calcium score at end of 2024 - so I have ordered to be done at [facility].   Also, how did he do with chantix for smoking cessation?

## 2023-04-16 NOTE — Telephone Encounter (Signed)
Spoke to pt, pt states he's doing good with chantix. Pt requested info regarding order Dr. Reece Agar placed at [facility], be sent to his mychart? Call back # [phone]

## 2023-04-16 NOTE — Telephone Encounter (Signed)
Sent info via [employer]. Anticipate referral team will do likewise.

## 2023-04-16 NOTE — Telephone Encounter (Signed)
Lvm asking pt to call back. Need to relay Dr. G's message and get answer to his question.  

## 2023-04-20 ENCOUNTER — Encounter: Payer: Self-pay | Admitting: *Deleted

## 2023-04-22 ENCOUNTER — Other Ambulatory Visit: Payer: Self-pay | Admitting: Family Medicine

## 2023-04-22 DIAGNOSIS — N529 Male erectile dysfunction, unspecified: Secondary | ICD-10-CM

## 2023-04-23 ENCOUNTER — Other Ambulatory Visit: Payer: Self-pay | Admitting: Family Medicine

## 2023-04-23 DIAGNOSIS — N529 Male erectile dysfunction, unspecified: Secondary | ICD-10-CM

## 2023-04-24 NOTE — Telephone Encounter (Signed)
 ERx

## 2023-05-08 ENCOUNTER — Other Ambulatory Visit: Payer: Self-pay | Admitting: Family Medicine

## 2023-05-08 DIAGNOSIS — M47812 Spondylosis without myelopathy or radiculopathy, cervical region: Secondary | ICD-10-CM

## 2023-05-09 NOTE — Telephone Encounter (Signed)
Ibuprofen Last filled:  02/23/23, #90 Last OV:  09/29/22, CPE Next OV:  10/02/23, CPE

## 2023-07-10 ENCOUNTER — Other Ambulatory Visit: Payer: Self-pay | Admitting: Family Medicine

## 2023-07-10 DIAGNOSIS — J449 Chronic obstructive pulmonary disease, unspecified: Secondary | ICD-10-CM

## 2023-07-10 NOTE — Telephone Encounter (Signed)
 Atrovent Last filled:  09/29/22, #30 mL Last OV:  09/29/22, CPE Next OV:  10/02/23, CPE

## 2023-07-11 NOTE — Telephone Encounter (Signed)
 ERx

## 2023-08-03 ENCOUNTER — Other Ambulatory Visit: Payer: Self-pay | Admitting: Family Medicine

## 2023-08-03 DIAGNOSIS — J449 Chronic obstructive pulmonary disease, unspecified: Secondary | ICD-10-CM

## 2023-08-08 NOTE — Telephone Encounter (Signed)
 ERx

## 2023-08-08 NOTE — Telephone Encounter (Signed)
 Message from pharmacy:  REQUEST FOR 90 DAYS PRESCRIPTION.   Atrovent  Last filled:  07/11/23, #30 mL Last OV:  09/29/22, CPE Next OV:  10/02/23, CPE

## 2023-09-23 ENCOUNTER — Other Ambulatory Visit: Payer: Self-pay | Admitting: Family Medicine

## 2023-09-23 DIAGNOSIS — E538 Deficiency of other specified B group vitamins: Secondary | ICD-10-CM

## 2023-09-23 DIAGNOSIS — E785 Hyperlipidemia, unspecified: Secondary | ICD-10-CM

## 2023-09-23 DIAGNOSIS — Z125 Encounter for screening for malignant neoplasm of prostate: Secondary | ICD-10-CM

## 2023-09-25 ENCOUNTER — Other Ambulatory Visit (INDEPENDENT_AMBULATORY_CARE_PROVIDER_SITE_OTHER): Payer: 59

## 2023-09-25 DIAGNOSIS — Z125 Encounter for screening for malignant neoplasm of prostate: Secondary | ICD-10-CM

## 2023-09-25 DIAGNOSIS — E538 Deficiency of other specified B group vitamins: Secondary | ICD-10-CM

## 2023-09-25 DIAGNOSIS — E785 Hyperlipidemia, unspecified: Secondary | ICD-10-CM

## 2023-10-01 ENCOUNTER — Ambulatory Visit: Payer: Self-pay | Admitting: Family Medicine

## 2023-10-02 ENCOUNTER — Ambulatory Visit (INDEPENDENT_AMBULATORY_CARE_PROVIDER_SITE_OTHER): Payer: 59 | Admitting: Family Medicine

## 2023-10-02 ENCOUNTER — Encounter: Payer: Self-pay | Admitting: Family Medicine

## 2023-10-02 VITALS — BP 130/78 | HR 71 | Temp 98.2°F | Ht 71.25 in | Wt 205.5 lb

## 2023-10-02 DIAGNOSIS — Z0000 Encounter for general adult medical examination without abnormal findings: Secondary | ICD-10-CM

## 2023-10-02 DIAGNOSIS — N529 Male erectile dysfunction, unspecified: Secondary | ICD-10-CM

## 2023-10-02 DIAGNOSIS — E785 Hyperlipidemia, unspecified: Secondary | ICD-10-CM

## 2023-10-02 DIAGNOSIS — M5030 Other cervical disc degeneration, unspecified cervical region: Secondary | ICD-10-CM

## 2023-10-02 DIAGNOSIS — Z7189 Other specified counseling: Secondary | ICD-10-CM | POA: Diagnosis not present

## 2023-10-02 DIAGNOSIS — I2510 Atherosclerotic heart disease of native coronary artery without angina pectoris: Secondary | ICD-10-CM

## 2023-10-02 DIAGNOSIS — M47812 Spondylosis without myelopathy or radiculopathy, cervical region: Secondary | ICD-10-CM | POA: Diagnosis not present

## 2023-10-02 DIAGNOSIS — J449 Chronic obstructive pulmonary disease, unspecified: Secondary | ICD-10-CM

## 2023-10-02 DIAGNOSIS — F17200 Nicotine dependence, unspecified, uncomplicated: Secondary | ICD-10-CM

## 2023-10-02 DIAGNOSIS — E538 Deficiency of other specified B group vitamins: Secondary | ICD-10-CM

## 2023-10-02 MED ORDER — TADALAFIL 20 MG PO TABS: 10.0000 mg | ORAL_TABLET | ORAL | 6 refills | Status: AC | PRN

## 2023-10-02 MED ORDER — CYANOCOBALAMIN 500 MCG PO TABS: 500.0000 ug | ORAL_TABLET | Freq: Every day | ORAL | Status: AC

## 2023-10-02 MED ORDER — IBUPROFEN 800 MG PO TABS: 800.0000 mg | ORAL_TABLET | Freq: Three times a day (TID) | ORAL | 0 refills | Status: DC | PRN

## 2023-10-02 MED ORDER — ROSUVASTATIN CALCIUM 5 MG PO TABS
5.0000 mg | ORAL_TABLET | Freq: Every day | ORAL | 4 refills | Status: DC
Start: 2023-10-02 — End: 2024-02-13

## 2023-10-02 NOTE — Assessment & Plan Note (Addendum)
 Pending cardiac CT with calcium  score.  Retry crestor 5mg  daily.

## 2023-10-02 NOTE — Assessment & Plan Note (Signed)
 Continues ibuprofen  800mg  1/2 tab daily PRN arthritis pain. Takes this with pepcid .

## 2023-10-02 NOTE — Assessment & Plan Note (Addendum)
 Congratulated on smoking cessation 9d ago!  Continue lung cancer screening.  Check baseline urinalysis in long term smoker.

## 2023-10-02 NOTE — Assessment & Plan Note (Signed)
 Levels stable on b12 500mcg daily replacement - continue

## 2023-10-02 NOTE — Assessment & Plan Note (Signed)
 Takes ibuprofen  800mg  1/2 tab for this.

## 2023-10-02 NOTE — Progress Notes (Signed)
 Ph: [phone] Fax: [phone]   Patient ID: Gregory Johnson, male    DOB: 1960/12/08, 63 y.o.   MRN: [id number]  This visit was conducted in person.  BP 130/78   Pulse 71   Temp 98.2 F (36.8 C) (Oral)   Ht 5' 11.25 (1.81 m)   Wt 205 lb 8 oz (93.2 kg)   SpO2 94%   BMI 28.46 kg/m    CC: CPE Subjective:   HPI: Gregory Johnson is a 63 y.o. 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    HLD - stopped lipitor due to fatigue and aches. This improved symptoms.   Quit smoking 9 days ago! Previous 1/2 ppd. Quitting cold [place].   Cardiac CT with CAC score ordered 03/2023 - still has not been scheduled. # provided to get this scheduled.   Taking ibuprofen  800mg  PRN arthritis pain - takes 1/2 tab daily.  Has seen spine clinic in [facility] for known cervical DDD. Has discussed surgery previously.   Preventative: Colonoscopy 06/2020 - diverticulosis, ext hem, rpt 10 yrs ([place])  Prostate screening - yearly screen. No prostate symptoms.  Lung cancer screening - started 08/2020, latest 10/2022.  Flu - declines  COVID vaccine Pfizer x2, no booster Tdap 2012, Td 2016  Pneumococcal - discussed to consider Shingrix - discussed  Advanced directive - has this at home. Wife is HCPOA. Full code. Wouldn't want prolonged life support if terminal condition.  Seat belt use discussed  Sunscreen use discussed. No changing moles. h/o cancerous spots removed from skin [place]).  Sleep - averaging 8 hours/night Smoking - 1/2ppd. Quit 2025! >20 PY hx.  Alcohol - 3 drinks/wk Dentist q6 mo  Eye exam yearly, no glaucoma history Bowel - no constipation   Caffeine: 2 cups coffee, 1 coke daily, lots of soda   Lives with wife, 2 daughters who recently graduated college, 3 dogs   Edu: some college   Occupation: IT sales professional, retired  Activity: walks, treadmill, gym  Diet: good water, daily fruits/vegetables       Relevant past medical, surgical, family and social history reviewed and updated as indicated. Interim medical history since our last visit reviewed. Allergies and medications reviewed and updated. Outpatient Medications Prior to Visit  Medication Sig Dispense Refill   famotidine  (PEPCID ) 20 MG tablet Take 1 tablet (20 mg total) by mouth 2 (two) times daily as needed for heartburn or indigestion (take with ibuprofen ). 90 tablet 1   fluticasone  (FLONASE ) 50 MCG/ACT nasal spray Place 2 sprays into both nostrils at bedtime. 16 mL 6   ipratropium (ATROVENT ) 0.03 % nasal spray PLACE 2 SPRAYS INTO BOTH NOSTRILS 2 (TWO) TIMES DAILY AS NEEDED FOR RHINITIS. 90 mL 1   loratadine  (CLARITIN ) 10 MG tablet Take 1 tablet (10 mg total) by mouth daily.     Multiple Vitamins-Minerals (CENTRUM SILVER  50+MEN) TABS Take 1 tablet by mouth daily.     ibuprofen  (ADVIL ) 800 MG tablet TAKE 1 TABLET BY MOUTH EVERY 8 HOURS AS NEEDED 90 tablet 0   sildenafil  (REVATIO ) 20 MG tablet TAKE 2-5 TABLETS (40-100 MG TOTAL) BY MOUTH DAILY AS NEEDED (RELATIONS). 45 tablet 3   atorvastatin  (LIPITOR) 20 MG tablet Take 1 tablet (20 mg total) by mouth daily. (Patient not taking: Reported on 10/02/2023) 90 tablet 4   varenicline  (CHANTIX ) 1 MG tablet TAKE 1 TABLET BY MOUTH TWICE A DAY (Patient not taking: Reported on 10/02/2023) 60  tablet 0   betamethasone  acetate-betamethasone  sodium phosphate (CELESTONE ) injection 3 mg      betamethasone  acetate-betamethasone  sodium phosphate (CELESTONE ) injection 3 mg      No facility-administered medications prior to visit.     Per HPI unless specifically indicated in ROS section below Review of Systems  Constitutional:  Positive for appetite change (increased after quitting smoking). Negative for activity change, chills, fatigue, fever and unexpected weight change.  HENT:  Negative for hearing loss.   Eyes:  Negative for visual disturbance.  Respiratory:  Negative for cough, chest tightness,  shortness of breath and wheezing.   Cardiovascular:  Negative for chest pain, palpitations and leg swelling.  Gastrointestinal:  Negative for abdominal distention, abdominal pain, blood in stool, constipation, diarrhea, nausea and vomiting.  Genitourinary:  Negative for difficulty urinating and hematuria.  Musculoskeletal:  Negative for arthralgias, myalgias and neck pain.  Skin:  Negative for rash.  Neurological:  Negative for dizziness, seizures, syncope and headaches.  Hematological:  Negative for adenopathy. Bruises/bleeds easily.  Psychiatric/Behavioral:  Negative for dysphoric mood. The patient is not nervous/anxious.     Objective:  BP 130/78   Pulse 71   Temp 98.2 F (36.8 C) (Oral)   Ht 5' 11.25 (1.81 m)   Wt 205 lb 8 oz (93.2 kg)   SpO2 94%   BMI 28.46 kg/m   Wt Readings from Last 3 Encounters:  10/02/23 205 lb 8 oz (93.2 kg)  09/29/22 200 lb 6 oz (90.9 kg)  11/08/21 203 lb (92.1 kg)      Physical Exam Vitals and nursing note reviewed.  Constitutional:      General: He is not in acute distress.    Appearance: Normal appearance. He is well-developed. He is not ill-appearing.  HENT:     Head: Normocephalic and atraumatic.     Right Ear: Hearing, tympanic membrane, ear canal and external ear normal.     Left Ear: Hearing, tympanic membrane, ear canal and external ear normal.     Mouth/Throat:     Mouth: Mucous membranes are moist.     Pharynx: Oropharynx is clear. No oropharyngeal exudate or posterior oropharyngeal erythema.   Eyes:     General: No scleral icterus.    Extraocular Movements: Extraocular movements intact.     Conjunctiva/sclera: Conjunctivae normal.     Pupils: Pupils are equal, round, and reactive to light.   Neck:     Thyroid: No thyroid mass or thyromegaly.   Cardiovascular:     Rate and Rhythm: Normal rate and regular rhythm.     Pulses: Normal pulses.          Radial pulses are 2+ on the right side and 2+ on the left side.     Heart  sounds: Normal heart sounds. No murmur heard. Pulmonary:     Effort: Pulmonary effort is normal. No respiratory distress.     Breath sounds: Normal breath sounds. No wheezing, rhonchi or rales.  Abdominal:     General: Bowel sounds are normal. There is no distension.     Palpations: Abdomen is soft. There is no mass.     Tenderness: There is no abdominal tenderness. There is no guarding or rebound.     Hernia: No hernia is present.   Musculoskeletal:        General: Normal range of motion.     Cervical back: Normal range of motion and neck supple.     Right lower leg: No edema.  Left lower leg: No edema.  Lymphadenopathy:     Cervical: No cervical adenopathy.   Skin:    General: Skin is warm and dry.     Findings: No rash.   Neurological:     General: No focal deficit present.     Mental Status: He is alert and oriented to person, place, and time.   Psychiatric:        Mood and Affect: Mood normal.        Behavior: Behavior normal.        Thought Content: Thought content normal.        Judgment: Judgment normal.       Results for orders placed or performed in visit on 09/25/23  Vitamin B12   Collection Time: 09/25/23  7:27 AM  Result Value Ref Range   Vitamin B-12 458 211 - 911 pg/mL  PSA   Collection Time: 09/25/23  7:27 AM  Result Value Ref Range   PSA 1.05 0.10 - 4.00 ng/mL  Comprehensive metabolic panel with GFR   Collection Time: 09/25/23  7:27 AM  Result Value Ref Range   Sodium 142 135 - 145 mEq/L   Potassium 4.0 3.5 - 5.1 mEq/L   Chloride 109 96 - 112 mEq/L   CO2 28 19 - 32 mEq/L   Glucose, Bld 99 70 - 99 mg/dL   BUN 14 6 - 23 mg/dL   Creatinine, Ser 4.54 0.40 - 1.50 mg/dL   Total Bilirubin 0.9 0.2 - 1.2 mg/dL   Alkaline Phosphatase 52 39 - 117 U/L   AST 17 0 - 37 U/L   ALT 19 0 - 53 U/L   Total Protein 6.3 6.0 - 8.3 g/dL   Albumin 4.1 3.5 - 5.2 g/dL   GFR 09.81 >19.14 mL/min   Calcium  8.7 8.4 - 10.5 mg/dL  Lipid panel   Collection Time:  09/25/23  7:27 AM  Result Value Ref Range   Cholesterol 163 0 - 200 mg/dL   Triglycerides 782.9 (H) 0.0 - 149.0 mg/dL   HDL 56.21 (L) >30.86 mg/dL   VLDL 57.8 0.0 - 46.9 mg/dL   LDL Cholesterol 99 0 - 99 mg/dL   Total CHOL/HDL Ratio 6    NonHDL 134.36     Assessment & Plan:   Problem List Items Addressed This Visit     Health maintenance examination - Primary (Chronic)   Preventative protocols reviewed and updated unless pt declined. Discussed healthy diet and lifestyle.       Relevant Orders   Urinalysis, Routine w reflex microscopic   Advanced directives, counseling/discussion (Chronic)   Advanced directive - has this at home. Wife is HCPOA. Full code. Wouldn't want prolonged life support if terminal condition.       ED (erectile dysfunction)   Has been on generic sildenafil . Requests trial tadalafil 20mg  1/2-1 tab PRN.       Smoker   Congratulated on smoking cessation 9d ago!  Continue lung cancer screening.  Check baseline urinalysis in long term smoker.       Relevant Orders   Urinalysis, Routine w reflex microscopic   Osteoarthritis   Continues ibuprofen  800mg  1/2 tab daily PRN arthritis pain. Takes this with pepcid .       Relevant Medications   ibuprofen  (ADVIL ) 800 MG tablet   Dyslipidemia   Chronic. Stopped atorva 20mg  due to fatigue and myalgias.  Will trial rosuvastatin 5mg  daily. Discussed option to decrease frequency if needed.  # provided to call and schedule  cardiac CT with CAC score. The 10-year ASCVD risk score (Arnett DK, et al., 2019) is: 12.6%   Values used to calculate the score:     Age: 40 years     Clincally relevant sex: Male     Is Non-Hispanic African American: No     Diabetic: No     Tobacco smoker: No     Systolic Blood Pressure: 130 mmHg     Is BP treated: No     HDL Cholesterol: 28.9 mg/dL     Total Cholesterol: 163 mg/dL       Relevant Medications   rosuvastatin (CRESTOR) 5 MG tablet   Low serum vitamin B12   Levels  stable on b12 500mcg daily replacement - continue      CAD (coronary artery disease)   Pending cardiac CT with calcium  score.  Retry crestor 5mg  daily.       Relevant Medications   tadalafil (CIALIS) 20 MG tablet   rosuvastatin (CRESTOR) 5 MG tablet   COPD (chronic obstructive pulmonary disease) (HCC)   Incidental finding on imaging. Denies respiratory symptoms. Quit smoking 09/2023!       DDD (degenerative disc disease), cervical   Takes ibuprofen  800mg  1/2 tab for this.         Meds ordered this encounter  Medications   ibuprofen  (ADVIL ) 800 MG tablet    Sig: Take 1 tablet (800 mg total) by mouth every 8 (eight) hours as needed.    Dispense:  90 tablet    Refill:  0   cyanocobalamin  (V-R VITAMIN B-12) 500 MCG tablet    Sig: Take 1 tablet (500 mcg total) by mouth daily.   tadalafil (CIALIS) 20 MG tablet    Sig: Take 0.5-1 tablets (10-20 mg total) by mouth every other day as needed for erectile dysfunction.    Dispense:  10 tablet    Refill:  6   rosuvastatin (CRESTOR) 5 MG tablet    Sig: Take 1 tablet (5 mg total) by mouth daily.    Dispense:  90 tablet    Refill:  4    Orders Placed This Encounter  Procedures   Urinalysis, Routine w reflex microscopic    Standing Status:   Future    Expiration Date:   10/01/2024    Patient Instructions  Bring back urine specimen at your convenience.   Call [phone] to schedule cardiac CT with calcium  score.  Congratulations on quitting smoking!  Try Crestor 5mg  daily.  Consider shingrix shot as well as pneumonia shot.  Return as needed or in 1 year for next physical.   Follow up plan: Return in about 1 year (around 10/01/2024) for annual exam, prior fasting for blood work.  Claire Crick, MD

## 2023-10-02 NOTE — Assessment & Plan Note (Signed)
 Incidental finding on imaging. Denies respiratory symptoms. Quit smoking 09/2023!

## 2023-10-02 NOTE — Assessment & Plan Note (Signed)
 Preventative protocols reviewed and updated unless pt declined. Discussed healthy diet and lifestyle.

## 2023-10-02 NOTE — Assessment & Plan Note (Addendum)
 Chronic. Stopped atorva 20mg  due to fatigue and myalgias.  Will trial rosuvastatin 5mg  daily. Discussed option to decrease frequency if needed.  # provided to call and schedule cardiac CT with CAC score. The 10-year ASCVD risk score (Arnett DK, et al., 2019) is: 12.6%   Values used to calculate the score:     Age: 63 years     Clincally relevant sex: Male     Is Non-Hispanic African American: No     Diabetic: No     Tobacco smoker: No     Systolic Blood Pressure: 130 mmHg     Is BP treated: No     HDL Cholesterol: 28.9 mg/dL     Total Cholesterol: 163 mg/dL

## 2023-10-02 NOTE — Patient Instructions (Addendum)
 Bring back urine specimen at your convenience.   Call [phone] to schedule cardiac CT with calcium  score.  Congratulations on quitting smoking!  Try Crestor 5mg  daily.  Consider shingrix shot as well as pneumonia shot.  Return as needed or in 1 year for next physical.

## 2023-10-02 NOTE — Assessment & Plan Note (Signed)
 Advanced directive - has this at home. Wife is HCPOA. Full code. Wouldn't want prolonged life support if terminal condition.

## 2023-10-02 NOTE — Assessment & Plan Note (Signed)
 Has been on generic sildenafil . Requests trial tadalafil 20mg  1/2-1 tab PRN.

## 2023-10-05 ENCOUNTER — Other Ambulatory Visit: Payer: Self-pay | Admitting: Radiology

## 2023-10-05 DIAGNOSIS — F17200 Nicotine dependence, unspecified, uncomplicated: Secondary | ICD-10-CM

## 2023-10-05 DIAGNOSIS — Z0000 Encounter for general adult medical examination without abnormal findings: Secondary | ICD-10-CM

## 2023-10-05 NOTE — Addendum Note (Signed)
 Addended by: Gerry Krone on: 10/05/2023 02:54 PM   Modules accepted: Orders

## 2023-10-06 ENCOUNTER — Ambulatory Visit: Payer: Self-pay | Admitting: Family Medicine

## 2023-10-06 LAB — URINALYSIS, ROUTINE W REFLEX MICROSCOPIC
Bilirubin Urine: NEGATIVE
Glucose, UA: NEGATIVE
Hgb urine dipstick: NEGATIVE
Leukocytes,Ua: NEGATIVE
Nitrite: NEGATIVE
Protein, ur: NEGATIVE
Specific Gravity, Urine: 1.032 (ref 1.001–1.035)
pH: <=5 — AB (ref 5.0–8.0)

## 2023-10-06 LAB — EXTRA URINE SPECIMEN

## 2023-11-30 ENCOUNTER — Encounter: Payer: Self-pay | Admitting: Family Medicine

## 2023-11-30 ENCOUNTER — Ambulatory Visit: Admitting: Family Medicine

## 2023-11-30 VITALS — BP 110/60 | HR 71 | Temp 99.0°F | Ht 71.25 in | Wt 207.2 lb

## 2023-11-30 DIAGNOSIS — U071 COVID-19: Secondary | ICD-10-CM | POA: Diagnosis not present

## 2023-11-30 DIAGNOSIS — J029 Acute pharyngitis, unspecified: Secondary | ICD-10-CM | POA: Diagnosis not present

## 2023-11-30 DIAGNOSIS — R059 Cough, unspecified: Secondary | ICD-10-CM | POA: Diagnosis not present

## 2023-11-30 LAB — POCT RAPID STREP A (OFFICE): Rapid Strep A Screen: NEGATIVE

## 2023-11-30 LAB — POC COVID19 BINAXNOW: SARS Coronavirus 2 Ag: POSITIVE — AB

## 2023-11-30 MED ORDER — NIRMATRELVIR/RITONAVIR (PAXLOVID)TABLET
3.0000 | ORAL_TABLET | Freq: Two times a day (BID) | ORAL | 0 refills | Status: AC
Start: 2023-11-30 — End: 2023-12-05

## 2023-11-30 MED ORDER — PROMETHAZINE-DM 6.25-15 MG/5ML PO SYRP: 5.0000 mL | ORAL_SOLUTION | Freq: Three times a day (TID) | ORAL | 0 refills | Status: AC | PRN

## 2023-11-30 NOTE — Assessment & Plan Note (Signed)
 Day 3  Mild to moderate symptoms in pt with risk factors  Reassuring exam and pulse ox  Pt has history of copd- but not bothered by it an no wheezing at all   Discussed pros/cons of anti viral  Paxlovid  prescribed and handout given (recommend he hold crestor  while taking it)   Disc symptomatic care - see instructions on AVS  Prometh  Dm (with caution of sedation) prescription for cough   Update if not starting to improve in a week or if worsening  Call back and Er precautions noted in detail today    Discussed recommendation for isolation and masking as well

## 2023-11-30 NOTE — Patient Instructions (Addendum)
 Drink fluids and rest  Try the promethazine  dm for cough and runny nose  Nasal saline for congestion as needed  Breathing steam can help also  Tylenol for fever or pain or headache  Please alert us  if symptoms worsen (if severe or short of breath please go to the ER)    Claritin  will help runny nose  Continue your nasal sprays if helpful    If you can get it - take the  generic paxolovid as directed  Hold crestor  while taking this   Isolate yourself until your symptoms are better   When you do return to public - wear a mask for another week

## 2023-11-30 NOTE — Progress Notes (Signed)
 Subjective:    Patient ID: Gregory Johnson, male    DOB: 1960-07-01, 63 y.o.   MRN: [id number]  HPI  Wt Readings from Last 3 Encounters:  11/30/23 207 lb 4 oz (94 kg)  10/02/23 205 lb 8 oz (93.2 kg)  09/29/22 200 lb 6 oz (90.9 kg)   28.70 kg/m  Vitals:   11/30/23 1050  BP: 110/60  Pulse: 71  Temp: 99 F (37.2 C)  SpO2: 95%   63 yo pt of Dr Gregory Johnson presents with  Gregory Johnson symptoms including  Symptoms started Tuesday night  Cough- deep - dry so far  Can hear a little chest congestion  No wheezing  No trouble breathing  Cannot sleep  ST - pretty mild  Nasal congestion - runny nose also  Very tired    No n/v/d No appetite  No change in his taste or smell   Has a history of copd , CAD   Positive covid test today Results for orders placed or performed in visit on 11/30/23  POC COVID-19   Collection Time: 11/30/23 11:00 AM  Result Value Ref Range   SARS Coronavirus 2 Ag Positive (A) Negative  Rapid Strep A   Collection Time: 11/30/23 11:00 AM  Result Value Ref Range   Rapid Strep A Screen Negative Negative     Over the counter  Mucinex (night time formula)   On med list  Atrovent  ns Flonase    Not taking his claritin      Lab Results  Component Value Date   NA 142 09/25/2023   K 4.0 09/25/2023   CO2 28 09/25/2023   GLUCOSE 99 09/25/2023   BUN 14 09/25/2023   CREATININE 1.06 09/25/2023   CALCIUM  8.7 09/25/2023   GFR 74.99 09/25/2023        Patient Active Problem List   Diagnosis Date Noted   COVID-19 11/30/2023   Advanced directives, counseling/discussion 10/02/2023   Bilateral occipital neuralgia 11/08/2021   Cervicalgia 11/08/2021   Chronic neck pain 11/08/2021   Atherosclerosis of aorta (HCC) 09/26/2021   CAD (coronary artery disease) 09/26/2021   COPD (chronic obstructive pulmonary disease) (HCC) 09/26/2021   Occipital headache 09/26/2021   DDD (degenerative disc disease), cervical 09/26/2021   Low serum vitamin B12 09/17/2021   Right  elbow pain 05/28/2019   Dyslipidemia 07/16/2017   Smoker 05/17/2016   Osteoarthritis 05/17/2016   Insomnia 12/29/2014   Health maintenance examination 10/08/2012   ED (erectile dysfunction)    Seasonal allergies    Past Medical History:  Diagnosis Date   ED (erectile dysfunction)    Orthostatic headache 10/24/2011   found to have T3/4 prominent lateral osteophytes on CT myelo with epidural contrast at levels below this, resolved after fluoro guided blood patch (Dr. Onita Johnson)   Seasonal allergies    Smoker    minimal   Past Surgical History:  Procedure Laterality Date   APPENDECTOMY  1978   Bicep and Labrum repair  8/16   COLONOSCOPY WITH PROPOFOL  N/A 07/05/2020   diverticulosis, ext hem, rpt 10 yrs (Gregory Johnson)    EPIDURAL BLOOD PATCH  10/2011   spontaneous low CSF pressure HAs   HERNIA REPAIR  1979   left side   Social History   Tobacco Use   Smoking status: Former    Current packs/day: 0.00    Average packs/day: 1 pack/day for 25.0 years (25.0 ttl pk-yrs)    Types: Cigarettes    Quit date: 2025    Years since quitting: 0.6  Smokeless tobacco: Never  Vaping Use   Vaping status: Never Used  Substance Use Topics   Alcohol use: Yes    Comment: 2-3 per week   Drug use: No   Family History  Problem Relation Age of Onset   Diabetes Mother    Hyperlipidemia Mother    Dementia Father 77   Heart Problems Sister        unknown type   Cancer Maternal Grandmother        breast cancer   Stroke Maternal Grandfather    CAD Maternal Grandfather        MI   Cancer Paternal Grandmother        bone cancer   Coronary artery disease Neg Hx    Migraines Neg Hx    No Known Allergies Current Outpatient Medications on File Prior to Visit  Medication Sig Dispense Refill   cyanocobalamin  (V-R VITAMIN B-12) 500 MCG tablet Take 1 tablet (500 mcg total) by mouth daily.     famotidine  (PEPCID ) 20 MG tablet Take 1 tablet (20 mg total) by mouth 2 (two) times daily as needed for heartburn or  indigestion (take with ibuprofen ). 90 tablet 1   fluticasone  (FLONASE ) 50 MCG/ACT nasal spray Place 2 sprays into both nostrils at bedtime. 16 mL 6   ibuprofen  (ADVIL ) 800 MG tablet Take 1 tablet (800 mg total) by mouth every 8 (eight) hours as needed. 90 tablet 0   ipratropium (ATROVENT ) 0.03 % nasal spray PLACE 2 SPRAYS INTO BOTH NOSTRILS 2 (TWO) TIMES DAILY AS NEEDED FOR RHINITIS. 90 mL 1   loratadine  (CLARITIN ) 10 MG tablet Take 1 tablet (10 mg total) by mouth daily.     Multiple Vitamins-Minerals (CENTRUM SILVER  50+MEN) TABS Take 1 tablet by mouth daily.     rosuvastatin  (CRESTOR ) 5 MG tablet Take 1 tablet (5 mg total) by mouth daily. 90 tablet 4   tadalafil  (CIALIS ) 20 MG tablet Take 0.5-1 tablets (10-20 mg total) by mouth every other day as needed for erectile dysfunction. 10 tablet 6   No current facility-administered medications on file prior to visit.    Review of Systems  Constitutional:  Positive for appetite change and fatigue. Negative for fever.  HENT:  Positive for congestion, postnasal drip, rhinorrhea, sinus pressure, sneezing and sore throat. Negative for ear pain.   Eyes:  Negative for pain and discharge.  Respiratory:  Positive for cough. Negative for shortness of breath, wheezing and stridor.   Cardiovascular:  Negative for chest pain.  Gastrointestinal:  Negative for diarrhea, nausea and vomiting.  Genitourinary:  Negative for frequency, hematuria and urgency.  Musculoskeletal:  Negative for arthralgias and myalgias.  Skin:  Negative for rash.  Neurological:  Positive for headaches. Negative for dizziness, weakness and light-headedness.  Psychiatric/Behavioral:  Negative for confusion and dysphoric mood.        Objective:   Physical Exam Constitutional:      General: He is not in acute distress.    Appearance: Normal appearance. He is well-developed and normal weight. He is not ill-appearing, toxic-appearing or diaphoretic.  HENT:     Head: Normocephalic and  atraumatic.     Comments: Nares are injected and congested      Right Ear: Tympanic membrane, ear canal and external ear normal.     Left Ear: Tympanic membrane, ear canal and external ear normal.     Nose: Congestion and rhinorrhea present.     Mouth/Throat:     Mouth: Mucous membranes are moist.  Pharynx: Oropharynx is clear. No oropharyngeal exudate or posterior oropharyngeal erythema.     Comments: Clear pnd  Eyes:     General:        Right eye: No discharge.        Left eye: No discharge.     Conjunctiva/sclera: Conjunctivae normal.     Pupils: Pupils are equal, round, and reactive to light.  Cardiovascular:     Rate and Rhythm: Normal rate.     Heart sounds: Normal heart sounds.  Pulmonary:     Effort: Pulmonary effort is normal. No respiratory distress.     Breath sounds: Normal breath sounds. No stridor. No wheezing, rhonchi or rales.     Comments: Bs are mildly distant No wheeze even on forced expiration  Good air exch Chest:     Chest wall: No tenderness.  Musculoskeletal:     Cervical back: Normal range of motion and neck supple.  Lymphadenopathy:     Cervical: No cervical adenopathy.  Skin:    General: Skin is warm and dry.     Capillary Refill: Capillary refill takes less than 2 seconds.     Findings: No rash.  Neurological:     Mental Status: He is alert.     Cranial Nerves: No cranial nerve deficit.  Psychiatric:        Mood and Affect: Mood normal.           Assessment & Plan:   Problem List Items Addressed This Visit       Other   COVID-19 - Primary   Day 3  Mild to moderate symptoms in pt with risk factors  Reassuring exam and pulse ox  Pt has history of copd- but not bothered by it an no wheezing at all   Discussed pros/cons of anti viral  Paxlovid  prescribed and handout given (recommend he hold crestor  while taking it)   Disc symptomatic care - see instructions on AVS  Prometh  Dm (with caution of sedation) prescription for cough    Update if not starting to improve in a week or if worsening  Call back and Er precautions noted in detail today    Discussed recommendation for isolation and masking as well          Relevant Medications   nirmatrelvir /ritonavir  (PAXLOVID ) 20 x 150 MG & 10 x 100MG  TABS   Other Visit Diagnoses       Sore throat       Relevant Orders   POC COVID-19 (Completed)   Rapid Strep A (Completed)     Cough, unspecified type       Relevant Orders   POC COVID-19 (Completed)

## 2024-02-13 ENCOUNTER — Ambulatory Visit: Payer: Self-pay

## 2024-02-13 ENCOUNTER — Ambulatory Visit: Payer: Self-pay | Admitting: Family Medicine

## 2024-02-13 ENCOUNTER — Ambulatory Visit: Admitting: Family Medicine

## 2024-02-13 ENCOUNTER — Encounter: Payer: Self-pay | Admitting: Family Medicine

## 2024-02-13 VITALS — BP 138/82 | HR 64 | Temp 98.1°F | Ht 71.25 in | Wt 210.1 lb

## 2024-02-13 DIAGNOSIS — R1013 Epigastric pain: Secondary | ICD-10-CM

## 2024-02-13 DIAGNOSIS — K5900 Constipation, unspecified: Secondary | ICD-10-CM | POA: Diagnosis not present

## 2024-02-13 LAB — CBC WITH DIFFERENTIAL/PLATELET
Basophils Absolute: 0.1 K/uL (ref 0.0–0.1)
Basophils Relative: 1 % (ref 0.0–3.0)
Eosinophils Absolute: 0.2 K/uL (ref 0.0–0.7)
Eosinophils Relative: 2.7 % (ref 0.0–5.0)
HCT: 43.2 % (ref 39.0–52.0)
Hemoglobin: 15.2 g/dL (ref 13.0–17.0)
Lymphocytes Relative: 25.6 % (ref 12.0–46.0)
Lymphs Abs: 1.4 K/uL (ref 0.7–4.0)
MCHC: 35.2 g/dL (ref 30.0–36.0)
MCV: 105.5 fl — ABNORMAL HIGH (ref 78.0–100.0)
Monocytes Absolute: 0.3 K/uL (ref 0.1–1.0)
Monocytes Relative: 6.1 % (ref 3.0–12.0)
Neutro Abs: 3.6 K/uL (ref 1.4–7.7)
Neutrophils Relative %: 64.6 % (ref 43.0–77.0)
Platelets: 211 K/uL (ref 150.0–400.0)
RBC: 4.09 Mil/uL — ABNORMAL LOW (ref 4.22–5.81)
RDW: 13 % (ref 11.5–15.5)
WBC: 5.6 K/uL (ref 4.0–10.5)

## 2024-02-13 LAB — LIPASE: Lipase: 16 U/L (ref 11.0–59.0)

## 2024-02-13 MED ORDER — PANTOPRAZOLE SODIUM 40 MG PO TBEC: 40.0000 mg | DELAYED_RELEASE_TABLET | Freq: Every day | ORAL | 3 refills | Status: AC

## 2024-02-13 NOTE — Assessment & Plan Note (Signed)
 Acute, new Increase water in diet, fiber.  Start MiraLAX 17 g p.o. daily.

## 2024-02-13 NOTE — Telephone Encounter (Signed)
 FYI Only or Action Required?: FYI only for provider.  Patient was last seen in primary care on 11/30/2023 by Randeen Laine LABOR, MD.  Called Nurse Triage reporting Abdominal Pain.  Symptoms began a week ago.  Interventions attempted: OTC medications: Ibuprofen .  Symptoms are: gradually worsening.  Triage Disposition: See Physician Within 24 Hours  Patient/caregiver understands and will follow disposition?: Yes       Copied from CRM [id number]. Topic: Clinical - Red Word Triage >> Feb 13, 2024  8:00 AM Carlyon D wrote: Red Word that prompted transfer to Nurse Triage: Abdominal pain mid to upper abdomin and goes to the back. Reason for Disposition  [1] MODERATE pain (e.g., interferes with normal activities) AND [2] pain comes and goes (cramps) AND [3] present > 24 hours  (Exception: Pain with Vomiting or Diarrhea - see that Guideline.)  Answer Assessment - Initial Assessment Questions 1. LOCATION: Where does it hurt?      Mid to upper abdominal area  2. RADIATION: Does the pain shoot anywhere else? (e.g., chest, back)     Radiates to the back  3. ONSET: When did the pain begin? (Minutes, hours or days ago)      1 week ago  5. PATTERN Does the pain come and go, or is it constant?     Comes and goes  6. SEVERITY: How bad is the pain?  (e.g., Scale 1-10; mild, moderate, or severe)     Can be moderate to severe  7. RECURRENT SYMPTOM: Have you ever had this type of stomach pain before? If Yes, ask: When was the last time? and What happened that time?      No  8. CAUSE: What do you think is causing the stomach pain? (e.g., gallstones, recent abdominal surgery)     Kidney infection  9. RELIEVING/AGGRAVATING FACTORS: What makes it better or worse? (e.g., antacids, bending or twisting motion, bowel movement)     Laying down at night  10. OTHER SYMPTOMS: Do you have any other symptoms? (e.g., back pain, diarrhea, fever, urination pain, vomiting)       Constipation    Taking ibuprofen  for symptoms and worse at night.  Protocols used: Abdominal Pain - Male-A-AH

## 2024-02-13 NOTE — Assessment & Plan Note (Signed)
 Acute, differential diagnosis includes gastritis versus peptic ulcer disease versus pancreatitis versus gallbladder disease  Patient does have associated constipation.  No sign and symptom of infection. Given patient has been on ibuprofen  high dose every 2 to 3 days for the last year, there is some concern for NSAID related gastritis/peptic ulcer disease.  Patient does drink moderate amount of alcohol.  Encouraged patient to stop ibuprofen .  Will evaluate with labs including complete metabolic panel, CBC and lipase.  Will check H. pylori breath test.  Patient will stop NSAIDs, avoid acidic foods, alcohol and soda. Start pantoprazole 40 mg daily  Go to ER if severe pain or bleeding.

## 2024-02-13 NOTE — Telephone Encounter (Signed)
 Appointment with Dr. Avelina 02/13/2024 at 9:00 am

## 2024-02-13 NOTE — Progress Notes (Signed)
 Patient ID: ZI [place], male    DOB: [birth year], 63 y.o.   MRN: [id number]  This visit was conducted in person.  BP 138/82   Pulse 64   Temp 98.1 F (36.7 C) (Oral)   Ht 5' 11.25 (1.81 m)   Wt 210 lb 2 oz (95.3 kg)   SpO2 93%   BMI 29.10 kg/m    CC:  Chief Complaint  Patient presents with   Abdominal Pain    C/o abd pain in epigastric region. Started about 1.5 wks ago. Occurs when lying flat and radiates to back. Denies N/V/D. Tried ibuprofen , helpful.    Labs Only    Subjective:   HPI: Gregory Johnson is a 63 y.o. male presenting on 02/13/2024 for Abdominal Pain (C/o abd pain in epigastric region. Started about 1.5 wks ago. Occurs when lying flat and radiates to back. Denies N/V/D. Tried ibuprofen , helpful. ) and Labs Only  PCP: [facility]   New onset pain in epigastric region ongoing x 1.5 weeks Pain radiates to his back. And seems to be worse when lying flat... mainly at night. No change in pain with food. Has been having more constipation in last week... using laxative. BM now every 3-4 days.  No N/V.  No specific heartburn, no sour taste in throat.   No blood in stool,  dark stool after laxative.. not black.  No new medications. Has started probiotic.  EtOH use: 2-3 shots a week   Has been using ibuprofen  which has been somewhat helpful with pain.  Has to wake and take an ibuprofen  1/2 800 mg. Usually taking ibuprofen  every 2-3 days in last year for neck pain arthritis... does take [employer] brand Pepcid    Former smoker with history of coronary artery disease, COPD      Colonoscopy:  2022 unremarkable, did have tics.   Relevant past medical, surgical, family and social history reviewed and updated as indicated. Interim medical history since our last visit reviewed. Allergies and medications reviewed and updated. Outpatient Medications Prior to Visit  Medication Sig Dispense Refill   cyanocobalamin  (V-R VITAMIN B-12) 500 MCG tablet Take 1 tablet (500  mcg total) by mouth daily.     famotidine  (PEPCID ) 20 MG tablet Take 1 tablet (20 mg total) by mouth 2 (two) times daily as needed for heartburn or indigestion (take with ibuprofen ). 90 tablet 1   fluticasone  (FLONASE ) 50 MCG/ACT nasal spray Place 2 sprays into both nostrils at bedtime. 16 mL 6   ibuprofen  (ADVIL ) 800 MG tablet Take 1 tablet (800 mg total) by mouth every 8 (eight) hours as needed. 90 tablet 0   ipratropium (ATROVENT ) 0.03 % nasal spray PLACE 2 SPRAYS INTO BOTH NOSTRILS 2 (TWO) TIMES DAILY AS NEEDED FOR RHINITIS. 90 mL 1   Multiple Vitamins-Minerals (CENTRUM SILVER  50+MEN) TABS Take 1 tablet by mouth daily.     promethazine -dextromethorphan (PROMETHAZINE -DM) 6.25-15 MG/5ML syrup Take 5 mLs by mouth 3 (three) times daily as needed for cough. Caution of sedation 118 mL 0   tadalafil  (CIALIS ) 20 MG tablet Take 0.5-1 tablets (10-20 mg total) by mouth every other day as needed for erectile dysfunction. 10 tablet 6   loratadine  (CLARITIN ) 10 MG tablet Take 1 tablet (10 mg total) by mouth daily.     rosuvastatin  (CRESTOR ) 5 MG tablet Take 1 tablet (5 mg total) by mouth daily. 90 tablet 4   No facility-administered medications prior to visit.     Per HPI unless specifically indicated in  ROS section below Review of Systems  Constitutional:  Negative for fatigue and fever.  HENT:  Negative for ear pain.   Eyes:  Negative for pain.  Respiratory:  Negative for cough and shortness of breath.   Cardiovascular:  Negative for chest pain, palpitations and leg swelling.  Gastrointestinal:  Positive for abdominal pain and constipation. Negative for abdominal distention, blood in stool, diarrhea, nausea and rectal pain.  Genitourinary:  Negative for dysuria.  Musculoskeletal:  Negative for arthralgias.  Neurological:  Negative for syncope, light-headedness and headaches.  Psychiatric/Behavioral:  Negative for dysphoric mood.    Objective:  BP 138/82   Pulse 64   Temp 98.1 F (36.7 C)  (Oral)   Ht 5' 11.25 (1.81 m)   Wt 210 lb 2 oz (95.3 kg)   SpO2 93%   BMI 29.10 kg/m   Wt Readings from Last 3 Encounters:  02/13/24 210 lb 2 oz (95.3 kg)  11/30/23 207 lb 4 oz (94 kg)  10/02/23 205 lb 8 oz (93.2 kg)      Physical Exam Vitals reviewed.  Constitutional:      Appearance: He is well-developed.  HENT:     Head: Normocephalic.     Right Ear: Hearing normal.     Left Ear: Hearing normal.     Nose: Nose normal.  Neck:     Thyroid: No thyroid mass or thyromegaly.     Vascular: No carotid bruit.     Trachea: Trachea normal.  Cardiovascular:     Rate and Rhythm: Normal rate and regular rhythm.     Pulses: Normal pulses.     Heart sounds: Heart sounds not distant. No murmur heard.    No friction rub. No gallop.     Comments: No peripheral edema Pulmonary:     Effort: Pulmonary effort is normal. No respiratory distress.     Breath sounds: Normal breath sounds.  Abdominal:     General: Abdomen is protuberant. Bowel sounds are normal. There is no distension.     Tenderness: There is abdominal tenderness in the right upper quadrant and epigastric area. There is no right CVA tenderness, left CVA tenderness, guarding or rebound.     Hernia: No hernia is present.  Skin:    General: Skin is warm and dry.     Findings: No rash.  Psychiatric:        Speech: Speech normal.        Behavior: Behavior normal.        Thought Content: Thought content normal.       Results for orders placed or performed in visit on 11/30/23  POC COVID-19   Collection Time: 11/30/23 11:00 AM  Result Value Ref Range   SARS Coronavirus 2 Ag Positive (A) Negative  Rapid Strep A   Collection Time: 11/30/23 11:00 AM  Result Value Ref Range   Rapid Strep A Screen Negative Negative    Assessment and Plan  Epigastric abdominal pain Assessment & Plan: Acute, differential diagnosis includes gastritis versus peptic ulcer disease versus pancreatitis versus gallbladder disease  Patient does  have associated constipation.  No sign and symptom of infection. Given patient has been on ibuprofen  high dose every 2 to 3 days for the last year, there is some concern for NSAID related gastritis/peptic ulcer disease.  Patient does drink moderate amount of alcohol.  Encouraged patient to stop ibuprofen .  Will evaluate with labs including complete metabolic panel, CBC and lipase.  Will check H. pylori breath test.  Patient will stop NSAIDs, avoid acidic foods, alcohol and soda. Start pantoprazole 40 mg daily  Go to ER if severe pain or bleeding.  Orders: -     CBC with Differential/Platelet -     Lipase -     Comprehensive metabolic panel with GFR; Future -     H. pylori breath test  Constipation, unspecified constipation type Assessment & Plan: Acute, new Increase water in diet, fiber.  Start MiraLAX 17 g p.o. daily.   Other orders -     Pantoprazole Sodium; Take 1 tablet (40 mg total) by mouth daily.  Dispense: 30 tablet; Refill: 3    No follow-ups on file.   Greig Ring, MD

## 2024-02-14 LAB — H. PYLORI BREATH TEST: H. pylori Breath Test: NOT DETECTED

## 2024-03-31 ENCOUNTER — Other Ambulatory Visit: Payer: Self-pay | Admitting: Family Medicine

## 2024-03-31 DIAGNOSIS — M47812 Spondylosis without myelopathy or radiculopathy, cervical region: Secondary | ICD-10-CM

## 2024-04-01 NOTE — Telephone Encounter (Signed)
Ok to refill for patient? ?

## 2024-04-02 NOTE — Telephone Encounter (Signed)
 ERx #30 Was seen 01/2024 with epigastric pain concern for NSAID related gastritis - would recommend he minimize meloxicam use - start with tylenol for pain.

## 2024-09-29 ENCOUNTER — Other Ambulatory Visit

## 2024-10-06 ENCOUNTER — Encounter: Admitting: Family Medicine
